# Patient Record
Sex: Female | Born: 1960
Health system: Southern US, Community
[De-identification: ages and names within clinical notes are randomized; demographics above are authoritative.]

## PROBLEM LIST (undated history)

## (undated) DIAGNOSIS — F329 Major depressive disorder, single episode, unspecified: Secondary | ICD-10-CM

## (undated) DIAGNOSIS — F32A Depression, unspecified: Secondary | ICD-10-CM

## (undated) DIAGNOSIS — E079 Disorder of thyroid, unspecified: Secondary | ICD-10-CM

## (undated) DIAGNOSIS — T7840XA Allergy, unspecified, initial encounter: Secondary | ICD-10-CM

## (undated) DIAGNOSIS — R51 Headache: Secondary | ICD-10-CM

## (undated) DIAGNOSIS — G4733 Obstructive sleep apnea (adult) (pediatric): Secondary | ICD-10-CM

## (undated) DIAGNOSIS — F419 Anxiety disorder, unspecified: Secondary | ICD-10-CM

## (undated) DIAGNOSIS — E785 Hyperlipidemia, unspecified: Secondary | ICD-10-CM

## (undated) HISTORY — PX: HERNIA REPAIR: SHX51

## (undated) HISTORY — DX: Major depressive disorder, single episode, unspecified: F32.9

## (undated) HISTORY — PX: ABDOMINAL HYSTERECTOMY: SHX81

## (undated) HISTORY — DX: Depression, unspecified: F32.A

## (undated) HISTORY — DX: Headache: R51

## (undated) HISTORY — DX: Allergy, unspecified, initial encounter: T78.40XA

## (undated) HISTORY — DX: Disorder of thyroid, unspecified: E07.9

## (undated) HISTORY — DX: Hyperlipidemia, unspecified: E78.5

## (undated) HISTORY — DX: Anxiety disorder, unspecified: F41.9

## (undated) HISTORY — DX: Obstructive sleep apnea (adult) (pediatric): G47.33

---

## 2007-03-25 ENCOUNTER — Encounter: Payer: Self-pay | Admitting: Internal Medicine

## 2007-04-07 ENCOUNTER — Ambulatory Visit: Payer: Self-pay | Admitting: Internal Medicine

## 2007-04-07 DIAGNOSIS — F4323 Adjustment disorder with mixed anxiety and depressed mood: Secondary | ICD-10-CM

## 2007-04-07 DIAGNOSIS — J309 Allergic rhinitis, unspecified: Secondary | ICD-10-CM | POA: Insufficient documentation

## 2007-04-07 DIAGNOSIS — E039 Hypothyroidism, unspecified: Secondary | ICD-10-CM | POA: Insufficient documentation

## 2007-04-07 DIAGNOSIS — R519 Headache, unspecified: Secondary | ICD-10-CM | POA: Insufficient documentation

## 2007-04-07 DIAGNOSIS — R51 Headache: Secondary | ICD-10-CM

## 2007-04-07 DIAGNOSIS — E785 Hyperlipidemia, unspecified: Secondary | ICD-10-CM

## 2007-04-08 LAB — CONVERTED CEMR LAB
ALT: 17 units/L (ref 0–35)
Albumin: 3.9 g/dL (ref 3.5–5.2)
BUN: 10 mg/dL (ref 6–23)
Basophils Relative: 0.7 % (ref 0.0–1.0)
CO2: 32 meq/L (ref 19–32)
Calcium: 9.5 mg/dL (ref 8.4–10.5)
Chloride: 105 meq/L (ref 96–112)
Cholesterol: 170 mg/dL (ref 0–200)
Creatinine, Ser: 0.7 mg/dL (ref 0.4–1.2)
Eosinophils Absolute: 0.2 10*3/uL (ref 0.0–0.6)
Eosinophils Relative: 2.1 % (ref 0.0–5.0)
Hemoglobin: 12.6 g/dL (ref 12.0–15.0)
MCV: 86.1 fL (ref 78.0–100.0)
Neutrophils Relative %: 66.7 % (ref 43.0–77.0)
RBC: 4.48 M/uL (ref 3.87–5.11)
Total Bilirubin: 0.6 mg/dL (ref 0.3–1.2)
WBC: 7.7 10*3/uL (ref 4.5–10.5)

## 2007-06-22 ENCOUNTER — Ambulatory Visit (HOSPITAL_COMMUNITY): Admission: RE | Admit: 2007-06-22 | Discharge: 2007-06-22 | Payer: Self-pay | Admitting: Obstetrics and Gynecology

## 2007-07-07 ENCOUNTER — Encounter: Admission: RE | Admit: 2007-07-07 | Discharge: 2007-07-07 | Payer: Self-pay | Admitting: Obstetrics and Gynecology

## 2007-10-20 ENCOUNTER — Encounter (INDEPENDENT_AMBULATORY_CARE_PROVIDER_SITE_OTHER): Payer: Self-pay

## 2008-01-19 ENCOUNTER — Encounter: Payer: Self-pay | Admitting: Internal Medicine

## 2008-02-03 ENCOUNTER — Encounter: Payer: Self-pay | Admitting: Internal Medicine

## 2008-02-04 ENCOUNTER — Telehealth: Payer: Self-pay | Admitting: Internal Medicine

## 2008-04-07 ENCOUNTER — Telehealth: Payer: Self-pay | Admitting: Internal Medicine

## 2008-09-02 ENCOUNTER — Ambulatory Visit: Payer: Self-pay | Admitting: Internal Medicine

## 2008-09-02 DIAGNOSIS — N390 Urinary tract infection, site not specified: Secondary | ICD-10-CM

## 2008-09-02 LAB — CONVERTED CEMR LAB
Blood in Urine, dipstick: NEGATIVE
Glucose, Urine, Semiquant: NEGATIVE
Ketones, urine, test strip: NEGATIVE
TSH: 0.32 microintl units/mL — ABNORMAL LOW (ref 0.35–5.50)
pH: 6

## 2009-02-01 ENCOUNTER — Encounter: Admission: RE | Admit: 2009-02-01 | Discharge: 2009-02-01 | Payer: Self-pay | Admitting: Obstetrics and Gynecology

## 2009-02-20 ENCOUNTER — Ambulatory Visit: Payer: Self-pay | Admitting: Internal Medicine

## 2009-02-20 DIAGNOSIS — Z872 Personal history of diseases of the skin and subcutaneous tissue: Secondary | ICD-10-CM | POA: Insufficient documentation

## 2009-03-07 ENCOUNTER — Encounter: Payer: Self-pay | Admitting: Internal Medicine

## 2009-05-10 ENCOUNTER — Encounter: Payer: Self-pay | Admitting: Internal Medicine

## 2009-08-01 ENCOUNTER — Ambulatory Visit: Payer: Self-pay | Admitting: Internal Medicine

## 2009-08-01 DIAGNOSIS — R300 Dysuria: Secondary | ICD-10-CM | POA: Insufficient documentation

## 2009-08-01 LAB — CONVERTED CEMR LAB
Bilirubin Urine: NEGATIVE
Blood in Urine, dipstick: NEGATIVE
Specific Gravity, Urine: 1.01
pH: 7

## 2010-01-03 ENCOUNTER — Ambulatory Visit: Payer: Self-pay | Admitting: Family Medicine

## 2010-01-04 ENCOUNTER — Ambulatory Visit: Payer: Self-pay | Admitting: Internal Medicine

## 2010-02-04 ENCOUNTER — Encounter: Payer: Self-pay | Admitting: Obstetrics and Gynecology

## 2010-02-13 NOTE — Assessment & Plan Note (Signed)
Summary: UTI?dm   Vital Signs:  Patient profile:   50 year old female Weight:      159 pounds Temp:     98.1 degrees F BP sitting:   122 / 80  (right arm) Cuff size:   regular  Vitals Entered By: Duard Brady LPN (August 01, 2009 4:06 PM) CC: c/o dysuria - also questions r/t synthroid medication   CC:  c/o dysuria - also questions r/t synthroid medication.  History of Present Illness: 50 year old patient who is seen today for follow-up.  She was treated recently for a UTI with 3 days of Cipro.  Symptoms have improved, but still and some mild dysuria and frequency.  Her medications were discussed and for several weeks.  He has inadvertently been taking simvastatin plus Vytorin and has not been taking her Synthroid.  She has a history depression, controlled on Effexor.  Allergies: 1)  ! Sulf-10  Past History:  Past Medical History: Reviewed history from 04/07/2007 and no changes required. Allergic rhinitis Depression Headache Hyperlipidemia Hypothyroidism  Past Surgical History: Reviewed history from 04/07/2007 and no changes required. hernia repair, and gynecologic surgery for endometriosis combined surgery, 1993 complete hysterectomy 2000  Review of Systems       The patient complains of weight gain.  The patient denies anorexia, fever, weight loss, vision loss, decreased hearing, hoarseness, chest pain, syncope, dyspnea on exertion, peripheral edema, prolonged cough, headaches, hemoptysis, abdominal pain, melena, hematochezia, severe indigestion/heartburn, hematuria, incontinence, genital sores, muscle weakness, suspicious skin lesions, transient blindness, difficulty walking, depression, unusual weight change, abnormal bleeding, enlarged lymph nodes, angioedema, and breast masses.    Physical Exam  General:  overweight-appearing.  110/72overweight-appearing.   Head:  Normocephalic and atraumatic without obvious abnormalities. No apparent alopecia or balding. Eyes:   No corneal or conjunctival inflammation noted. EOMI. Perrla. Funduscopic exam benign, without hemorrhages, exudates or papilledema. Vision grossly normal. Ears:  External ear exam shows no significant lesions or deformities.  Otoscopic examination reveals clear canals, tympanic membranes are intact bilaterally without bulging, retraction, inflammation or discharge. Hearing is grossly normal bilaterally. Mouth:  Oral mucosa and oropharynx without lesions or exudates.  Teeth in good repair. Neck:  No deformities, masses, or tenderness noted. Lungs:  Normal respiratory effort, chest expands symmetrically. Lungs are clear to auscultation, no crackles or wheezes. Heart:  Normal rate and regular rhythm. S1 and S2 normal without gallop, murmur, click, rub or other extra sounds. Abdomen:  Bowel sounds positive,abdomen soft and non-tender without masses, organomegaly or hernias noted.   Impression & Recommendations:  Problem # 1:  DYSURIA (ICD-788.1)  The following medications were removed from the medication list:    Ciprofloxacin Hcl 500 Mg Tabs (Ciprofloxacin hcl) ..... One twice daily Her updated medication list for this problem includes:    Ciprofloxacin Hcl 500 Mg Tabs (Ciprofloxacin hcl) ..... One twice daily  Orders: UA Dipstick w/o Micro (manual) ([address])  The following medications were removed from the medication list:    Ciprofloxacin Hcl 500 Mg Tabs (Ciprofloxacin hcl) ..... One twice daily Her updated medication list for this problem includes:    Ciprofloxacin Hcl 500 Mg Tabs (Ciprofloxacin hcl) ..... One twice daily  Problem # 2:  HYPOTHYROIDISM (ICD-244.9)  Her updated medication list for this problem includes:    Synthroid 75 Mcg Tabs (Levothyroxine sodium) .Marland Kitchen... 1 once daily  Her updated medication list for this problem includes:    Synthroid 75 Mcg Tabs (Levothyroxine sodium) .Marland Kitchen... 1 once daily  Problem # 3:  HYPERLIPIDEMIA (  ICD-272.4)  Her updated medication list for this  problem includes:    Simvastatin 20 Mg Tabs (Simvastatin) .Marland Kitchen... 1 once daily  Her updated medication list for this problem includes:    Simvastatin 20 Mg Tabs (Simvastatin) .Marland Kitchen... 1 once daily  Complete Medication List: 1)  Effexor Xr 150 Mg Cp24 (Venlafaxine hcl) .Marland Kitchen.. 1 two times a day 2)  Simvastatin 20 Mg Tabs (Simvastatin) .Marland Kitchen.. 1 once daily 3)  Synthroid 75 Mcg Tabs (Levothyroxine sodium) .Marland Kitchen.. 1 once daily 4)  Ciprofloxacin Hcl 500 Mg Tabs (Ciprofloxacin hcl) .... One twice daily  Patient Instructions: 1)  Please schedule a follow-up appointment in 4 months. 2)  Limit your Sodium (Salt) to less than 2 grams a day(slightly less than 1/2 a teaspoon) to prevent fluid retention, swelling, or worsening of symptoms. 3)  It is important that you exercise regularly at least 20 minutes 5 times a week. If you develop chest pain, have severe difficulty breathing, or feel very tired , stop exercising immediately and seek medical attention. 4)  You need to lose weight. Consider a lower calorie diet and regular exercise.  Prescriptions: CIPROFLOXACIN HCL 500 MG TABS (CIPROFLOXACIN HCL) one twice daily  #14 x 0   Entered and Authorized by:   Gordy Savers  MD   Signed by:   Gordy Savers  MD on 08/01/2009   Method used:   Electronically to        [facility]  [place] [address] (retail)       [address]       [place], [place]  [address]       Ph: [phone] or [phone]       Fax: [phone]   RxID:   [id number] SYNTHROID 75 MCG  TABS (LEVOTHYROXINE SODIUM) 1 once daily  #90 x 6   Entered and Authorized by:   Gordy Savers  MD   Signed by:   Gordy Savers  MD on 08/01/2009   Method used:   Electronically to        [facility]  [place] [address] (retail)       [address]       [place], [place]  [address]       Ph: [phone] or [phone]       Fax: [phone]   RxID:   [id number] SIMVASTATIN 20 MG  TABS (SIMVASTATIN) 1 once daily  #90 x 6   Entered and  Authorized by:   Gordy Savers  MD   Signed by:   Gordy Savers  MD on 08/01/2009   Method used:   [facility]ronically to        [facility]  [place] [address] (retail)       [address]       [place], [place]  [address]       Ph: [phone] or [phone]       Fax: [phone]   RxID:   [id number] EFFEXOR XR 150 MG  CP24 (VENLAFAXINE HCL) 1 two times a day  #180 x 6   Entered and Authorized by:   Gordy Savers  MD   Signed by:   Gordy Savers  MD on 08/01/2009   Method used:   [facility]        [facility]  [place] [phone] (retail)       [address]       [place], [place]  [address]       Ph: [phone] or [phone]       Fax: [phone]   RxID:   [id number]   Laboratory Results   Urine Tests  Date/Time Received: August 01, 2009 4:20 PM  Date/Time Reported: August 01, 2009 4:21 PM   Routine Urinalysis   Color: yellow Appearance: Hazy Glucose: negative   (Normal Range: Negative) Bilirubin: negative   (Normal Range: Negative) Ketone: negative   (Normal Range: Negative) Spec. Gravity: 1.010   (Normal Range: 1.003-1.035) Blood: negative   (Normal Range: Negative) pH: 7.0   (Normal Range: 5.0-8.0) Protein: negative   (Normal Range: Negative) Urobilinogen: 0.2   (Normal Range: 0-1) Nitrite: negative   (Normal Range: Negative) Leukocyte Esterace: trace   (Normal Range: Negative)

## 2010-02-13 NOTE — Assessment & Plan Note (Signed)
Summary: RAISED RED PATCH ON CHEST AREA/CJR   Vital Signs:  Patient profile:   50 year old female Weight:      155 pounds Temp:     98.6 degrees F oral BP sitting:   118 / 72  (right arm) Cuff size:   regular  Vitals Entered By: Raechel Ache, RN (February 20, 2009 1:50 PM) CC: red raised area on chest   CC:  red raised area on chest.  History of Present Illness: 50 year old patient who was seen today with concerns of suspicious skin lesions over her anterior chest.  She has a history of depression and dyslipidemia, and hypothyroidism.  She is unclear of the duration of the lesions, but probably at least two to 3 months  Allergies: 1)  ! Sulf-10  Past History:  Past Medical History: Reviewed history from 04/07/2007 and no changes required. Allergic rhinitis Depression Headache Hyperlipidemia Hypothyroidism  Review of Systems       The patient complains of suspicious skin lesions.  The patient denies anorexia, fever, weight loss, weight gain, vision loss, decreased hearing, hoarseness, chest pain, syncope, dyspnea on exertion, peripheral edema, prolonged cough, headaches, hemoptysis, abdominal pain, melena, hematochezia, severe indigestion/heartburn, hematuria, incontinence, genital sores, muscle weakness, transient blindness, difficulty walking, depression, unusual weight change, abnormal bleeding, enlarged lymph nodes, angioedema, and breast masses.    Physical Exam  General:  Well-developed,well-nourished,in no acute distress; alert,appropriate and cooperative throughout examination Skin:  two slightly raised, nodular lesions  over the anterior chest   Impression & Recommendations:  Problem # 1:  SKIN LESION, HX OF (ICD-V13.3)  Orders: Dermatology Referral (Derma)  Complete Medication List: 1)  Effexor Xr 150 Mg Cp24 (Venlafaxine hcl) .Marland Kitchen.. 1 two times a day 2)  Simvastatin 20 Mg Tabs (Simvastatin) .Marland Kitchen.. 1 once daily 3)  Synthroid 75 Mcg Tabs (Levothyroxine  sodium) .Marland Kitchen.. 1 once daily 4)  Ciprofloxacin Hcl 500 Mg Tabs (Ciprofloxacin hcl) .... One twice daily  Patient Instructions: 1)  dermatology referral as scheduled

## 2010-02-13 NOTE — Letter (Signed)
[facility]   Imported By: Maryln Gottron 05/15/2009 10:56:22  _____________________________________________________________________  External Attachment:    Type:   Image     Comment:   External Document

## 2010-02-13 NOTE — Consult Note (Signed)
Summary: [facility]  [facility]   Imported By: Maryln Gottron 03/23/2009 12:59:28  _____________________________________________________________________  External Attachment:    Type:   Image     Comment:   External Document

## 2010-02-15 NOTE — Assessment & Plan Note (Signed)
Summary: COUGH, CONGESTION // RS   Vital Signs:  Patient profile:   50 year old female Weight:      159 pounds Temp:     98.4 degrees F oral BP sitting:   128 / 72  (left arm) Cuff size:   regular CC: cold? head congestion/tight chest  Is Patient Diabetic? No   CC:  cold? head congestion/tight chest .  History of Present Illness: Sarah Wagner is a 50 year old, married female, nonsmoker, who comes in with a 5-day history of head congestion, postnasal drip, and cough.  No fever no earache no sore throat.  No sputum production.  No history of any lung disease or asthma.     Allergies: 1)  ! Sulf-10  Past History:  Past medical, surgical, family and social histories (including risk factors) reviewed for relevance to current acute and chronic problems.  Past Medical History: Reviewed history from 04/07/2007 and no changes required. Allergic rhinitis Depression Headache Hyperlipidemia Hypothyroidism  Past Surgical History: Reviewed history from 04/07/2007 and no changes required. hernia repair, and gynecologic surgery for endometriosis combined surgery, 1993 complete hysterectomy 2000  Family History: Reviewed history from 04/07/2007 and no changes required. father died at age 32, brain cancer.  Medical problems include diabetes Crohn's disease Graves' disease, dyslipidemia mother is 66, history of ovarian cancer history Parkinson's disease  sister with Crohn's colitis  Social History: Reviewed history from 04/07/2007 and no changes required. Married two children, a son, age 59, daughter, who is married at age 25  Review of Systems      See HPI  Physical Exam  General:  Well-developed,well-nourished,in no acute distress; alert,appropriate and cooperative throughout examination Head:  Normocephalic and atraumatic without obvious abnormalities. No apparent alopecia or balding. Eyes:  No corneal or conjunctival inflammation noted. EOMI. Perrla. Funduscopic exam benign,  without hemorrhages, exudates or papilledema. Vision grossly normal. Ears:  left ear canal normal.  Eardrum normal right ear canal packed with wax flushed Nose:  External nasal examination shows no deformity or inflammation. Nasal mucosa are pink and moist without lesions or exudates. Mouth:  Oral mucosa and oropharynx without lesions or exudates.  Teeth in good repair. Neck:  No deformities, masses, or tenderness noted. Lungs:  Normal respiratory effort, chest expands symmetrically. Lungs are clear to auscultation, no crackles or wheezes.   Problems:  Medical Problems Added: 1)  Dx of Viral Infection-unspec  (ICD-079.99)  Impression & Recommendations:  Problem # 1:  VIRAL INFECTION-UNSPEC (ICD-079.99) Assessment New  Her updated medication list for this problem includes:    Hydromet 5-1.5 Mg/84ml Syrp (Hydrocodone-homatropine) .Marland Kitchen... 1/2 to 1 tsp three times a day as needed cough  Complete Medication List: 1)  Effexor Xr 150 Mg Cp24 (Venlafaxine hcl) .Marland Kitchen.. 1 two times a day 2)  Simvastatin 20 Mg Tabs (Simvastatin) .Marland Kitchen.. 1 once daily 3)  Synthroid 75 Mcg Tabs (Levothyroxine sodium) .Marland Kitchen.. 1 once daily 4)  Ciprofloxacin Hcl 500 Mg Tabs (Ciprofloxacin hcl) .... One twice daily 5)  Hydromet 5-1.5 Mg/60ml Syrp (Hydrocodone-homatropine) .... 1/2 to 1 tsp three times a day as needed cough  Patient Instructions: 1)  drink 20 to 30 ounces of water daily. 2)  Vaporizer in her bedroom at night. 3)  Hydromet one half to 1 teaspoon at bedtime as needed for cough and cold Prescriptions: HYDROMET 5-1.5 MG/5ML SYRP (HYDROCODONE-HOMATROPINE) 1/2 to 1 tsp three times a day as needed cough  #4oz x 1   Entered and Authorized by:   Roderick Pee MD  Signed by:   Roderick Pee MD on 01/03/2010   Method used:   Print then Give to Patient   RxID:   [id number]    Orders Added: 1)  Est. Patient Level III [[address]]

## 2010-05-28 ENCOUNTER — Other Ambulatory Visit (HOSPITAL_COMMUNITY): Payer: Self-pay | Admitting: Obstetrics and Gynecology

## 2010-05-28 DIAGNOSIS — Z1231 Encounter for screening mammogram for malignant neoplasm of breast: Secondary | ICD-10-CM

## 2010-06-06 ENCOUNTER — Ambulatory Visit (HOSPITAL_COMMUNITY)
Admission: RE | Admit: 2010-06-06 | Discharge: 2010-06-06 | Disposition: A | Discharge status: Home / Self Care | Payer: Managed Care, Other (non HMO) | Source: Ambulatory Visit | Attending: Obstetrics and Gynecology | Admitting: Obstetrics and Gynecology

## 2010-06-06 DIAGNOSIS — Z1231 Encounter for screening mammogram for malignant neoplasm of breast: Secondary | ICD-10-CM | POA: Insufficient documentation

## 2010-08-27 ENCOUNTER — Other Ambulatory Visit: Payer: Self-pay | Admitting: Internal Medicine

## 2010-09-26 ENCOUNTER — Other Ambulatory Visit: Payer: Self-pay | Admitting: Obstetrics and Gynecology

## 2010-09-26 DIAGNOSIS — N61 Mastitis without abscess: Secondary | ICD-10-CM

## 2010-09-26 DIAGNOSIS — Z803 Family history of malignant neoplasm of breast: Secondary | ICD-10-CM

## 2010-09-27 ENCOUNTER — Other Ambulatory Visit: Payer: Self-pay | Admitting: Obstetrics and Gynecology

## 2010-09-27 DIAGNOSIS — N61 Mastitis without abscess: Secondary | ICD-10-CM

## 2010-09-27 DIAGNOSIS — Z803 Family history of malignant neoplasm of breast: Secondary | ICD-10-CM

## 2010-09-28 ENCOUNTER — Ambulatory Visit
Admission: RE | Admit: 2010-09-28 | Discharge: 2010-09-28 | Disposition: A | Discharge status: Home / Self Care | Payer: Managed Care, Other (non HMO) | Source: Ambulatory Visit | Attending: Obstetrics and Gynecology | Admitting: Obstetrics and Gynecology

## 2010-09-28 DIAGNOSIS — Z803 Family history of malignant neoplasm of breast: Secondary | ICD-10-CM

## 2010-09-28 DIAGNOSIS — N61 Mastitis without abscess: Secondary | ICD-10-CM

## 2010-11-22 ENCOUNTER — Telehealth: Payer: Self-pay

## 2010-11-22 NOTE — Telephone Encounter (Signed)
Pt called and states she feels like she has a virus and some wheezing along with chest congestion.  Pt states she is going out of town on Saturday and is concerned about her symptoms.  Pt is aware she has an appt on 11/23/10 at 4:00 pm.

## 2010-11-23 ENCOUNTER — Encounter: Payer: Self-pay | Admitting: Internal Medicine

## 2010-11-23 ENCOUNTER — Ambulatory Visit (INDEPENDENT_AMBULATORY_CARE_PROVIDER_SITE_OTHER): Payer: Managed Care, Other (non HMO) | Admitting: Internal Medicine

## 2010-11-23 DIAGNOSIS — J309 Allergic rhinitis, unspecified: Secondary | ICD-10-CM

## 2010-11-23 DIAGNOSIS — J069 Acute upper respiratory infection, unspecified: Secondary | ICD-10-CM

## 2010-11-23 MED ORDER — LEVOTHYROXINE SODIUM 75 MCG PO TABS: 75.0000 ug | ORAL_TABLET | Freq: Every day | ORAL | Status: DC

## 2010-11-23 MED ORDER — VENLAFAXINE HCL ER 150 MG PO CP24: 150.0000 mg | ORAL_CAPSULE | Freq: Every day | ORAL | Status: DC

## 2010-11-23 MED ORDER — SIMVASTATIN 20 MG PO TABS: 20.0000 mg | ORAL_TABLET | Freq: Every day | ORAL | Status: DC

## 2010-11-23 NOTE — Patient Instructions (Signed)
Get plenty of rest, Drink lots of  clear liquids, and use Tylenol or ibuprofen for fever and discomfort.    Call or return to clinic prn if these symptoms worsen or fail to improve as anticipated.  

## 2010-11-23 NOTE — Progress Notes (Signed)
  Subjective:    Patient ID: Sarah Wagner, female    DOB: 21-Jun-1960, 50 y.o.   MRN: [id number]  HPI  50 year old patient who presents with a six-day history of chest congestion cough and malaise earlier in the illness she had fever that has resolved. She remains slightly weak with nonproductive cough. Medical regimen includes doxycycline for dermatologic indications. She is planning on leaving for [place] for a 7 hour car trip tomorrow.    Review of Systems  Constitutional: Positive for fatigue. Negative for fever.  HENT: Positive for congestion and rhinorrhea. Negative for hearing loss, sore throat, dental problem, sinus pressure and tinnitus.   Eyes: Negative for pain, discharge and visual disturbance.  Respiratory: Positive for cough. Negative for shortness of breath.   Cardiovascular: Negative for chest pain, palpitations and leg swelling.  Gastrointestinal: Negative for nausea, vomiting, abdominal pain, diarrhea, constipation, blood in stool and abdominal distention.  Genitourinary: Negative for dysuria, urgency, frequency, hematuria, flank pain, vaginal bleeding, vaginal discharge, difficulty urinating, vaginal pain and pelvic pain.  Musculoskeletal: Negative for joint swelling, arthralgias and gait problem.  Skin: Negative for rash.  Neurological: Negative for dizziness, syncope, speech difficulty, weakness, numbness and headaches.  Hematological: Negative for adenopathy.  Psychiatric/Behavioral: Negative for behavioral problems, dysphoric mood and agitation. The patient is not nervous/anxious.        Objective:   Physical Exam  Constitutional: She is oriented to person, place, and time. She appears well-developed and well-nourished.  HENT:  Head: Normocephalic.  Right Ear: External ear normal.  Left Ear: External ear normal.  Mouth/Throat: Oropharynx is clear and moist.  Eyes: Conjunctivae and EOM are normal. Pupils are equal, round, and reactive to light.  Neck: Normal range of  motion. Neck supple. No thyromegaly present.  Cardiovascular: Normal rate, regular rhythm, normal heart sounds and intact distal pulses.   Pulmonary/Chest: Effort normal and breath sounds normal.  Abdominal: Soft. Bowel sounds are normal. She exhibits no mass. There is no tenderness.  Musculoskeletal: Normal range of motion.  Lymphadenopathy:    She has no cervical adenopathy.  Neurological: She is alert and oriented to person, place, and time.  Skin: Skin is warm and dry. No rash noted.  Psychiatric: She has a normal mood and affect. Her behavior is normal.          Assessment & Plan:    Viral URI. Symptomatic treatment discussed. Return here when necessary

## 2010-12-03 ENCOUNTER — Ambulatory Visit (INDEPENDENT_AMBULATORY_CARE_PROVIDER_SITE_OTHER): Payer: Managed Care, Other (non HMO) | Admitting: Internal Medicine

## 2010-12-03 ENCOUNTER — Encounter: Payer: Self-pay | Admitting: Internal Medicine

## 2010-12-03 VITALS — BP 126/80 | Temp 98.1°F | Wt 152.0 lb

## 2010-12-03 DIAGNOSIS — J069 Acute upper respiratory infection, unspecified: Secondary | ICD-10-CM

## 2010-12-03 MED ORDER — HYDROCODONE-HOMATROPINE 5-1.5 MG/5ML PO SYRP: 5.0000 mL | ORAL_SOLUTION | Freq: Four times a day (QID) | ORAL | Status: AC | PRN

## 2010-12-03 NOTE — Patient Instructions (Signed)
Get plenty of rest, Drink lots of  clear liquids, and use Tylenol or ibuprofen for fever and discomfort.    Call or return to clinic prn if these symptoms worsen or fail to improve as anticipated.  

## 2010-12-03 NOTE — Progress Notes (Signed)
Subjective:    Patient ID: Sarah Wagner, female    DOB: January 13, 1961, 50 y.o.   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 At that time she was on doxycycline for dermatologic indications. She improved but over the past few days has had increasing cough congestion low-grade fever and weakness. Her ears feel full there is been some head and chest congestion.  Past Medical History  Diagnosis Date  . Allergy   . Depression   . Headache   . Hyperlipidemia   . Thyroid disease     History   Social History  . Marital Status: Married    Spouse Name: N/A    Number of Children: N/A  . Years of Education: N/A   Occupational History  . Not on file.   Social History Main Topics  . Smoking status: Never Smoker   . Smokeless tobacco: Not on file  . Alcohol Use: No  . Drug Use: No  . Sexually Active:    Other Topics Concern  . Not on file   Social History Narrative  . No narrative on file    Past Surgical History  Procedure Date  . Hernia repair   . Abdominal hysterectomy     Family History  Problem Relation Age of Onset  . Cancer Mother     ovarian  . Parkinsonism Mother   . Cancer Father     brain  . Diabetes Father   . Crohn's disease Father   . Graves' disease Father   . Hyperlipidemia Father   . Crohn's disease Sister     Allergies  Allergen Reactions  . Sulfacetamide Sodium     Current Outpatient Prescriptions on File Prior to Visit  Medication Sig Dispense Refill  . hydrocortisone (WESTCORT) 0.2 % cream       . levothyroxine (SYNTHROID) 75 MCG tablet Take 1 tablet (75 mcg total) by mouth daily.  90 tablet  4  . simvastatin (ZOCOR) 20 MG tablet Take 1 tablet (20 mg total) by mouth at bedtime.  90 tablet  4  . tretinoin (RETIN-A) 0.05 % cream       . venlafaxine (EFFEXOR-XR) 150 MG 24 hr capsule Take 1 capsule (150 mg total) by mouth daily.  90 capsule  4    BP 126/80  Temp(Src) 98.1 F (36.7 C) (Oral)  Wt  152 lb (68.947 kg)  SpO2 96%   Review of Systems  Constitutional: Positive for fever, activity change, appetite change and fatigue.  HENT: Negative for hearing loss, congestion, sore throat, rhinorrhea, dental problem, sinus pressure and tinnitus.   Eyes: Negative for pain, discharge and visual disturbance.  Respiratory: Positive for cough. Negative for shortness of breath.   Cardiovascular: Negative for chest pain, palpitations and leg swelling.  Gastrointestinal: Negative for nausea, vomiting, abdominal pain, diarrhea, constipation, blood in stool and abdominal distention.  Genitourinary: Negative for dysuria, urgency, frequency, hematuria, flank pain, vaginal bleeding, vaginal discharge, difficulty urinating, vaginal pain and pelvic pain.  Musculoskeletal: Negative for joint swelling, arthralgias and gait problem.  Skin: Negative for rash.  Neurological: Positive for weakness. Negative for dizziness, syncope, speech difficulty, numbness and headaches.  Hematological: Negative for adenopathy.  Psychiatric/Behavioral: Negative for behavioral problems, dysphoric mood and agitation. The patient is not nervous/anxious.        Objective:   Physical Exam  Constitutional: She is oriented to person, place, and time. She appears well-developed and well-nourished.  HENT:  Head: Normocephalic.  Right Ear: External ear normal.  Left Ear: External ear normal.  Mouth/Throat: Oropharynx is clear and moist.  Eyes: Conjunctivae and EOM are normal. Pupils are equal, round, and reactive to light.  Neck: Normal range of motion. Neck supple. No thyromegaly present.  Cardiovascular: Normal rate, regular rhythm, normal heart sounds and intact distal pulses.   Pulmonary/Chest: Effort normal and breath sounds normal.  Abdominal: Soft. Bowel sounds are normal. She exhibits no mass. There is no tenderness.  Musculoskeletal: Normal range of motion.  Lymphadenopathy:    She has no cervical adenopathy.    Neurological: She is alert and oriented to person, place, and time.  Skin: Skin is warm and dry. No rash noted.  Psychiatric: She has a normal mood and affect. Her behavior is normal.          Assessment & Plan:    Viral URI. Will treat symptomatically. Dyslipidemia. Stable on simvastatin   Will call or return to clinic prn if these symptoms worsen or fail to improve as anticipated.

## 2011-12-02 ENCOUNTER — Other Ambulatory Visit: Payer: Self-pay | Admitting: Internal Medicine

## 2011-12-02 NOTE — Telephone Encounter (Signed)
Med filled.  

## 2012-06-29 ENCOUNTER — Telehealth: Payer: Self-pay | Admitting: Internal Medicine

## 2012-06-29 NOTE — Telephone Encounter (Signed)
Have patient check with her insurance about referral to ENT and their coverage;  this would also be acceptable referral

## 2012-06-29 NOTE — Telephone Encounter (Signed)
Pt was referred to oral surgeon by her dentist for a fibroid on her lower lip. [employer] would not cover that biopsy if done by oral surgeon. Wanting to know what other specialty would be appropriate to do this procedure (CPT code [address]). ENT? Pt requesting referral to whatever specialty is appropriate, and they will check with [employer] to see who within that specialty is covered under their network. Please advise.

## 2012-06-30 NOTE — Telephone Encounter (Signed)
I called and spoke to [place]. She will check with [employer] to see what ENT providers are covered in her network and call us back with a name so we can enter a referral.

## 2012-08-29 ENCOUNTER — Other Ambulatory Visit: Payer: Self-pay | Admitting: Internal Medicine

## 2012-09-02 ENCOUNTER — Ambulatory Visit (INDEPENDENT_AMBULATORY_CARE_PROVIDER_SITE_OTHER): Payer: Managed Care, Other (non HMO) | Admitting: Internal Medicine

## 2012-09-02 ENCOUNTER — Encounter: Payer: Self-pay | Admitting: Internal Medicine

## 2012-09-02 VITALS — BP 120/80 | HR 100 | Temp 98.2°F | Ht 60.0 in | Wt 147.0 lb

## 2012-09-02 DIAGNOSIS — E039 Hypothyroidism, unspecified: Secondary | ICD-10-CM

## 2012-09-02 DIAGNOSIS — F329 Major depressive disorder, single episode, unspecified: Secondary | ICD-10-CM

## 2012-09-02 DIAGNOSIS — Z23 Encounter for immunization: Secondary | ICD-10-CM

## 2012-09-02 DIAGNOSIS — E785 Hyperlipidemia, unspecified: Secondary | ICD-10-CM

## 2012-09-02 DIAGNOSIS — Z0000 Encounter for general adult medical examination without abnormal findings: Secondary | ICD-10-CM

## 2012-09-02 DIAGNOSIS — J309 Allergic rhinitis, unspecified: Secondary | ICD-10-CM

## 2012-09-02 DIAGNOSIS — F3289 Other specified depressive episodes: Secondary | ICD-10-CM

## 2012-09-02 LAB — COMPREHENSIVE METABOLIC PANEL WITH GFR
ALT: 11 U/L (ref 0–35)
AST: 14 U/L (ref 0–37)
Albumin: 4 g/dL (ref 3.5–5.2)
Alkaline Phosphatase: 73 U/L (ref 39–117)
BUN: 12 mg/dL (ref 6–23)
CO2: 29 meq/L (ref 19–32)
Calcium: 9.2 mg/dL (ref 8.4–10.5)
Chloride: 105 meq/L (ref 96–112)
Creatinine, Ser: 0.7 mg/dL (ref 0.4–1.2)
GFR: 88.45 mL/min
Glucose, Bld: 107 mg/dL — ABNORMAL HIGH (ref 70–99)
Potassium: 4.8 meq/L (ref 3.5–5.1)
Sodium: 139 meq/L (ref 135–145)
Total Bilirubin: 0.4 mg/dL (ref 0.3–1.2)
Total Protein: 7.7 g/dL (ref 6.0–8.3)

## 2012-09-02 LAB — LIPID PANEL
Cholesterol: 177 mg/dL (ref 0–200)
HDL: 38.7 mg/dL — ABNORMAL LOW
LDL Cholesterol: 105 mg/dL — ABNORMAL HIGH (ref 0–99)
Total CHOL/HDL Ratio: 5
Triglycerides: 166 mg/dL — ABNORMAL HIGH (ref 0.0–149.0)
VLDL: 33.2 mg/dL (ref 0.0–40.0)

## 2012-09-02 LAB — CBC WITH DIFFERENTIAL/PLATELET
Basophils Absolute: 0 10*3/uL (ref 0.0–0.1)
Basophils Relative: 0.5 % (ref 0.0–3.0)
Eosinophils Absolute: 0.2 10*3/uL (ref 0.0–0.7)
Eosinophils Relative: 2.5 % (ref 0.0–5.0)
HCT: 39.9 % (ref 36.0–46.0)
Hemoglobin: 13.5 g/dL (ref 12.0–15.0)
Lymphocytes Relative: 27.2 % (ref 12.0–46.0)
Lymphs Abs: 1.9 10*3/uL (ref 0.7–4.0)
MCHC: 33.8 g/dL (ref 30.0–36.0)
MCV: 84.5 fl (ref 78.0–100.0)
Monocytes Absolute: 0.5 10*3/uL (ref 0.1–1.0)
Monocytes Relative: 6.4 % (ref 3.0–12.0)
Neutro Abs: 4.5 10*3/uL (ref 1.4–7.7)
Neutrophils Relative %: 63.4 % (ref 43.0–77.0)
Platelets: 286 10*3/uL (ref 150.0–400.0)
RBC: 4.72 Mil/uL (ref 3.87–5.11)
RDW: 13.4 % (ref 11.5–14.6)
WBC: 7.1 10*3/uL (ref 4.5–10.5)

## 2012-09-02 LAB — TSH: TSH: 0.11 u[IU]/mL — ABNORMAL LOW (ref 0.35–5.50)

## 2012-09-02 MED ORDER — LEVOTHYROXINE SODIUM 75 MCG PO TABS: ORAL_TABLET | ORAL | Status: DC

## 2012-09-02 MED ORDER — VENLAFAXINE HCL ER 150 MG PO CP24: ORAL_CAPSULE | ORAL | Status: DC

## 2012-09-02 MED ORDER — SIMVASTATIN 20 MG PO TABS: ORAL_TABLET | ORAL | Status: DC

## 2012-09-02 NOTE — Progress Notes (Signed)
  Subjective:    Patient ID: Sarah Wagner, female    DOB: [birth year], 52 y.o.   MRN: [id number]  HPI  Current Allergies:  ! SULF-10 (SULFACETAMIDE SODIUM)   Past Medical History:  Allergic rhinitis  Depression  Headache  Hyperlipidemia  Hypothyroidism  (negative BRAC genetics)  Past Surgical History:  hernia repair, and gynecologic surgery for endometriosis combined surgery, 1993  complete hysterectomy 2000  Colonoscopy age 36  Family History:  father died at age 19, brain cancer. Medical problems include diabetes Crohn's disease Graves' disease, dyslipidemia  mother is 97, history of ovarian cancer history Parkinson's disease  sister with Crohn's colitis   Social History:  Married  two children, a son, age 44, daughter, who is married at age 19    Review of Systems     Objective:   Physical Exam        Assessment & Plan:

## 2012-09-02 NOTE — Progress Notes (Signed)
  Subjective:    Patient ID: Sarah Wagner, female    DOB: 1960/06/10, 52 y.o.   MRN: [id number]  HPI  52 year old patient who is seen today for a preventive health examination. She has treated dyslipidemia mild depression and hypothyroidism.  Family history is pertinent for a history of ovarian cancer affecting her mother. She has had genetic testing and is BRAC negative  She is status post hysterectomy for endometriosis. She is followed by gynecology in frequently.  Current Allergies:  ! SULF-10 (SULFACETAMIDE SODIUM)   Past Medical History:  Allergic rhinitis  Depression  Headache  Hyperlipidemia  Hypothyroidism   Past Surgical History:  hernia repair, and gynecologic surgery for endometriosis combined surgery, 1993  complete hysterectomy 2000  Colonoscopy age 29  Family History:  father died at age 8, brain cancer. Medical problems include diabetes Crohn's disease Graves' disease, dyslipidemia  mother, history of ovarian cancer history Parkinson's disease  sister with Crohn's colitis   Social History:  Married  two children, a son, age 46, daughter, who is married at age 71     Review of Systems  Constitutional: Positive for unexpected weight change. Negative for fever, appetite change and fatigue.  HENT: Negative for hearing loss, ear pain, nosebleeds, congestion, sore throat, mouth sores, trouble swallowing, neck stiffness, dental problem, voice change, sinus pressure and tinnitus.   Eyes: Negative for photophobia, pain, redness and visual disturbance.  Respiratory: Negative for cough, chest tightness and shortness of breath.   Cardiovascular: Negative for chest pain, palpitations and leg swelling.  Gastrointestinal: Negative for nausea, vomiting, abdominal pain, diarrhea, constipation, blood in stool, abdominal distention and rectal pain.  Genitourinary: Negative for dysuria, urgency, frequency, hematuria, flank pain, vaginal bleeding, vaginal discharge, difficulty  urinating, genital sores, vaginal pain, menstrual problem and pelvic pain.  Musculoskeletal: Negative for back pain and arthralgias.  Skin: Negative for rash.  Neurological: Negative for dizziness, syncope, speech difficulty, weakness, light-headedness, numbness and headaches.  Hematological: Negative for adenopathy. Does not bruise/bleed easily.  Psychiatric/Behavioral: Negative for suicidal ideas, behavioral problems, self-injury, dysphoric mood and agitation. The patient is not nervous/anxious.        Objective:   Physical Exam  Constitutional: She is oriented to person, place, and time. She appears well-developed and well-nourished.  HENT:  Head: Normocephalic and atraumatic.  Right Ear: External ear normal.  Left Ear: External ear normal.  Mouth/Throat: Oropharynx is clear and moist.  Eyes: Conjunctivae and EOM are normal.  Neck: Normal range of motion. Neck supple. No JVD present. No thyromegaly present.  Cardiovascular: Normal rate, regular rhythm, normal heart sounds and intact distal pulses.   No murmur heard. Pulmonary/Chest: Effort normal and breath sounds normal. She has no wheezes. She has no rales.  Abdominal: Soft. Bowel sounds are normal. She exhibits no distension and no mass. There is no tenderness. There is no rebound and no guarding.  Musculoskeletal: Normal range of motion. She exhibits no edema and no tenderness.  Neurological: She is alert and oriented to person, place, and time. She has normal reflexes. No cranial nerve deficit. She exhibits normal muscle tone. Coordination normal.  Skin: Skin is warm and dry. No rash noted.  Psychiatric: She has a normal mood and affect. Her behavior is normal.          Assessment & Plan:   Preventive health examination Dyslipidemia. We'll check a lipid profile Depression stable continue present therapy  Recheck 1 year or as needed Follow gynecology Mammogram

## 2012-09-02 NOTE — Patient Instructions (Signed)
You need to lose weight.  Consider a lower calorie diet and regular exercise.    It is important that you exercise regularly, at least 20 minutes 3 to 4 times per week.  If you develop chest pain or shortness of breath seek  medical attention.  Return in one year for follow-up  

## 2012-09-03 ENCOUNTER — Other Ambulatory Visit: Payer: Self-pay | Admitting: *Deleted

## 2012-09-03 MED ORDER — LEVOTHYROXINE SODIUM 50 MCG PO TABS: 50.0000 ug | ORAL_TABLET | Freq: Every day | ORAL | Status: DC

## 2013-06-14 ENCOUNTER — Telehealth: Payer: Self-pay | Admitting: Internal Medicine

## 2013-06-14 DIAGNOSIS — E039 Hypothyroidism, unspecified: Secondary | ICD-10-CM

## 2013-06-14 NOTE — Telephone Encounter (Signed)
Okay to order TSH? See message from pt.

## 2013-06-14 NOTE — Telephone Encounter (Signed)
ok 

## 2013-06-14 NOTE — Telephone Encounter (Signed)
Pt would like to have tsh check again. Md change her thyroid med 6 months ago. Can I sch?

## 2013-06-14 NOTE — Telephone Encounter (Signed)
Spoke to pt told her okay to repeat TSH order put in [facility] just need to call back and schedule lab appointment. Pt verbalized understanding.

## 2013-06-15 ENCOUNTER — Other Ambulatory Visit (INDEPENDENT_AMBULATORY_CARE_PROVIDER_SITE_OTHER): Payer: Managed Care, Other (non HMO)

## 2013-06-15 DIAGNOSIS — E039 Hypothyroidism, unspecified: Secondary | ICD-10-CM

## 2013-06-15 LAB — TSH: TSH: 4.55 u[IU]/mL — ABNORMAL HIGH (ref 0.35–4.50)

## 2013-06-21 ENCOUNTER — Telehealth: Payer: Self-pay | Admitting: Internal Medicine

## 2013-06-21 NOTE — Telephone Encounter (Signed)
Pt calling for TSH result, it was 4.55. Pt currently taking levothyroxine 50 mcg and change in dosage?

## 2013-06-21 NOTE — Telephone Encounter (Signed)
Pt would like results of tsh lab down last week.

## 2013-06-21 NOTE — Telephone Encounter (Signed)
Spoke to pt told her TSH was 4.55 , continue dosage prescribed, no change per Dr. Kirtland Bouchard. Pt verbalized understanding.

## 2013-06-21 NOTE — Telephone Encounter (Signed)
No change in therapy

## 2013-09-04 ENCOUNTER — Other Ambulatory Visit: Payer: Self-pay | Admitting: Internal Medicine

## 2013-12-02 ENCOUNTER — Other Ambulatory Visit: Payer: Self-pay | Admitting: Internal Medicine

## 2013-12-22 ENCOUNTER — Ambulatory Visit (INDEPENDENT_AMBULATORY_CARE_PROVIDER_SITE_OTHER): Payer: Managed Care, Other (non HMO) | Admitting: Family Medicine

## 2013-12-22 ENCOUNTER — Encounter: Payer: Self-pay | Admitting: Family Medicine

## 2013-12-22 VITALS — BP 128/80 | HR 88 | Temp 97.8°F | Wt 145.0 lb

## 2013-12-22 DIAGNOSIS — F411 Generalized anxiety disorder: Secondary | ICD-10-CM

## 2013-12-22 MED ORDER — VENLAFAXINE HCL ER 75 MG PO CP24: 75.0000 mg | ORAL_CAPSULE | Freq: Every day | ORAL | Status: DC

## 2013-12-22 NOTE — Patient Instructions (Signed)
Take an additional Effexor XR 75 mg (total of 225 mg daily) Follow up with Dr Kirtland BouchardK in 3-4 weeks if no improvement in anxiety symptoms.

## 2013-12-22 NOTE — Progress Notes (Signed)
   Subjective:    Patient ID: Sarah Wagner, female    DOB: 1960-08-27, 53 y.o.   MRN: [id number]  HPI Patient seen as a work in with increased anxiety symptoms. She has long history of anxiety and some depression and has been for many years on Effexor XR. Currently takes 150 mg once daily. Previously on dose up to 300 mg. She's had some stressors with her daughter undergoing recent separation and daughters had some issues with opioid abuse. She's had some difficulty sleeping. She started counseling yesterday. She is dealing with anxiety more than depression and previously has responded to higher doses of Effexor XR. She has very supportive husband. She continues to work full-time. She tends to have daily pervasive worries and these have escalated over recent months  Past Medical History  Diagnosis Date  . Allergy   . Depression   . Headache(784.0)   . Hyperlipidemia   . Thyroid disease    Past Surgical History  Procedure Laterality Date  . Hernia repair    . Abdominal hysterectomy      reports that she has never smoked. She does not have any smokeless tobacco history on file. She reports that she does not drink alcohol or use illicit drugs. family history includes Cancer in her father and mother; Crohn's disease in her father and sister; Diabetes in her father; Luiz BlareGraves' disease in her father; Hyperlipidemia in her father; Parkinsonism in her mother. Allergies  Allergen Reactions  . Sulfacetamide Sodium       Review of Systems  Constitutional: Negative for appetite change and unexpected weight change.  Respiratory: Negative for shortness of breath.   Cardiovascular: Negative for chest pain.  Psychiatric/Behavioral: Negative for suicidal ideas, confusion and agitation. The patient is nervous/anxious.        Objective:   Physical Exam  Constitutional: She appears well-developed and well-nourished.  Cardiovascular: Normal rate and regular rhythm.   No murmur  heard. Pulmonary/Chest: Effort normal and breath sounds normal. No respiratory distress. She has no wheezes. She has no rales.  Psychiatric: She has a normal mood and affect. Her behavior is normal. Judgment and thought content normal.          Assessment & Plan:  Chronic anxiety. Question generalized anxiety. Titrate Effexor XR to 225 mg once daily. Follow-up with primary in 3-4 weeks if not seeing significant improvements

## 2013-12-22 NOTE — Progress Notes (Signed)
Pre visit review using our clinic review tool, if applicable. No additional management support is needed unless otherwise documented below in the visit note. 

## 2014-03-03 ENCOUNTER — Other Ambulatory Visit: Payer: Self-pay | Admitting: Family Medicine

## 2014-03-23 ENCOUNTER — Other Ambulatory Visit: Payer: Self-pay | Admitting: Family Medicine

## 2014-03-24 NOTE — Telephone Encounter (Signed)
This looks like your pt.   

## 2014-06-03 ENCOUNTER — Telehealth: Payer: Self-pay | Admitting: Internal Medicine

## 2014-06-03 MED ORDER — VENLAFAXINE HCL ER 75 MG PO CP24: 75.0000 mg | ORAL_CAPSULE | Freq: Every day | ORAL | Status: DC

## 2014-06-03 MED ORDER — LEVOTHYROXINE SODIUM 50 MCG PO TABS: 50.0000 ug | ORAL_TABLET | Freq: Every day | ORAL | Status: DC

## 2014-06-03 MED ORDER — SIMVASTATIN 20 MG PO TABS: 20.0000 mg | ORAL_TABLET | Freq: Every day | ORAL | Status: DC

## 2014-06-03 MED ORDER — VENLAFAXINE HCL ER 150 MG PO CP24: 150.0000 mg | ORAL_CAPSULE | Freq: Every day | ORAL | Status: DC

## 2014-06-03 NOTE — Telephone Encounter (Signed)
Sarah Wagner, please work pt in sooner put two slots together for physical. I sent in 30 day supply of all medications until seen.

## 2014-06-03 NOTE — Telephone Encounter (Signed)
Pt not seen by dr Kirtland Bouchardk since 09/02/12. Pt is now out of meds. Will need refill of  levothyroxine (SYNTHROID, LEVOTHROID) 50 MCG tablet   (only has 3 tabs left) simvastatin (ZOCOR) 20 MG tablet venlafaxine XR (EFFEXOR XR) 75 MG 24 hr capsule venlafaxine XR (EFFEXOR-XR) 150 MG 24 hr capsule [facility] [place]   Made first availabe appt 7/21. Do I need to schedule pt sooner w/ a work in appt?

## 2014-06-03 NOTE — Telephone Encounter (Signed)
Pt has been scheduled sooner

## 2014-06-17 ENCOUNTER — Other Ambulatory Visit (INDEPENDENT_AMBULATORY_CARE_PROVIDER_SITE_OTHER): Payer: Managed Care, Other (non HMO)

## 2014-06-17 ENCOUNTER — Other Ambulatory Visit: Payer: Self-pay | Admitting: *Deleted

## 2014-06-17 DIAGNOSIS — E039 Hypothyroidism, unspecified: Secondary | ICD-10-CM

## 2014-06-17 DIAGNOSIS — Z0000 Encounter for general adult medical examination without abnormal findings: Secondary | ICD-10-CM | POA: Diagnosis not present

## 2014-06-17 LAB — CBC WITH DIFFERENTIAL/PLATELET
Basophils Absolute: 0.1 10*3/uL (ref 0.0–0.1)
Basophils Relative: 0.6 % (ref 0.0–3.0)
EOS ABS: 0.2 10*3/uL (ref 0.0–0.7)
Eosinophils Relative: 2.7 % (ref 0.0–5.0)
HEMATOCRIT: 41.6 % (ref 36.0–46.0)
Hemoglobin: 13.8 g/dL (ref 12.0–15.0)
LYMPHS ABS: 2.5 10*3/uL (ref 0.7–4.0)
Lymphocytes Relative: 31 % (ref 12.0–46.0)
MCHC: 33.1 g/dL (ref 30.0–36.0)
MCV: 86.4 fl (ref 78.0–100.0)
MONO ABS: 0.5 10*3/uL (ref 0.1–1.0)
MONOS PCT: 6.3 % (ref 3.0–12.0)
NEUTROS ABS: 4.8 10*3/uL (ref 1.4–7.7)
NEUTROS PCT: 59.4 % (ref 43.0–77.0)
Platelets: 305 10*3/uL (ref 150.0–400.0)
RBC: 4.81 Mil/uL (ref 3.87–5.11)
RDW: 13.7 % (ref 11.5–15.5)
WBC: 8.1 10*3/uL (ref 4.0–10.5)

## 2014-06-17 LAB — BASIC METABOLIC PANEL
BUN: 11 mg/dL (ref 6–23)
CALCIUM: 9.1 mg/dL (ref 8.4–10.5)
CO2: 31 mEq/L (ref 19–32)
CREATININE: 0.73 mg/dL (ref 0.40–1.20)
Chloride: 105 mEq/L (ref 96–112)
GFR: 87.86 mL/min (ref >60.00)
Glucose, Bld: 132 mg/dL — ABNORMAL HIGH (ref 70–99)
Potassium: 4.2 mEq/L (ref 3.5–5.1)
SODIUM: 140 meq/L (ref 135–145)

## 2014-06-17 LAB — LIPID PANEL
Cholesterol: 182 mg/dL (ref 0–200)
HDL: 44.9 mg/dL (ref >39.00)
LDL Cholesterol: 111 mg/dL — ABNORMAL HIGH (ref 0–99)
NONHDL: 137.1
TRIGLYCERIDES: 132 mg/dL (ref 0.0–149.0)
Total CHOL/HDL Ratio: 4
VLDL: 26.4 mg/dL (ref 0.0–40.0)

## 2014-06-17 LAB — POCT URINALYSIS DIPSTICK
Bilirubin, UA: NEGATIVE
GLUCOSE UA: NEGATIVE
Ketones, UA: NEGATIVE
Nitrite, UA: NEGATIVE
PH UA: 5.5
PROTEIN UA: NEGATIVE
RBC UA: NEGATIVE
UROBILINOGEN UA: 0.2

## 2014-06-17 LAB — HEPATIC FUNCTION PANEL
ALBUMIN: 4.1 g/dL (ref 3.5–5.2)
ALT: 13 U/L (ref 0–35)
AST: 14 U/L (ref 0–37)
Alkaline Phosphatase: 80 U/L (ref 39–117)
BILIRUBIN TOTAL: 0.3 mg/dL (ref 0.2–1.2)
Bilirubin, Direct: 0 mg/dL (ref 0.0–0.3)
TOTAL PROTEIN: 7.3 g/dL (ref 6.0–8.3)

## 2014-06-17 LAB — TSH: TSH: 6.26 u[IU]/mL — AB (ref 0.35–4.50)

## 2014-06-17 MED ORDER — LEVOTHYROXINE SODIUM 75 MCG PO TABS: 75.0000 ug | ORAL_TABLET | Freq: Every day | ORAL | Status: DC

## 2014-06-24 ENCOUNTER — Encounter: Payer: Managed Care, Other (non HMO) | Admitting: Internal Medicine

## 2014-07-07 ENCOUNTER — Encounter: Payer: Self-pay | Admitting: Internal Medicine

## 2014-07-07 ENCOUNTER — Ambulatory Visit (INDEPENDENT_AMBULATORY_CARE_PROVIDER_SITE_OTHER): Payer: Managed Care, Other (non HMO) | Admitting: Internal Medicine

## 2014-07-07 VITALS — BP 120/80 | HR 88 | Temp 98.0°F | Resp 20 | Ht 60.0 in | Wt 154.0 lb

## 2014-07-07 DIAGNOSIS — E039 Hypothyroidism, unspecified: Secondary | ICD-10-CM | POA: Diagnosis not present

## 2014-07-07 DIAGNOSIS — J3089 Other allergic rhinitis: Secondary | ICD-10-CM | POA: Diagnosis not present

## 2014-07-07 DIAGNOSIS — Z0000 Encounter for general adult medical examination without abnormal findings: Secondary | ICD-10-CM | POA: Diagnosis not present

## 2014-07-07 DIAGNOSIS — E785 Hyperlipidemia, unspecified: Secondary | ICD-10-CM | POA: Diagnosis not present

## 2014-07-07 MED ORDER — SIMVASTATIN 20 MG PO TABS: 20.0000 mg | ORAL_TABLET | Freq: Every day | ORAL | Status: DC

## 2014-07-07 MED ORDER — VENLAFAXINE HCL ER 150 MG PO CP24: 150.0000 mg | ORAL_CAPSULE | Freq: Every day | ORAL | Status: DC

## 2014-07-07 MED ORDER — VENLAFAXINE HCL ER 75 MG PO CP24: 75.0000 mg | ORAL_CAPSULE | Freq: Every day | ORAL | Status: DC

## 2014-07-07 MED ORDER — TRETINOIN 0.05 % EX CREA: TOPICAL_CREAM | Freq: Every day | CUTANEOUS | Status: DC

## 2014-07-07 NOTE — Patient Instructions (Signed)
It is important that you exercise regularly, at least 20 minutes 3 to 4 times per week.  If you develop chest pain or shortness of breath seek  medical attention.  You need to lose weight.  Consider a lower calorie diet and regular exercise.  Return in one year for follow-up  Health Maintenance Adopting a healthy lifestyle and getting preventive care can go a long way to promote health and wellness. Talk with your health care provider about what schedule of regular examinations is right for you. This is a good chance for you to check in with your provider about disease prevention and staying healthy. In between checkups, there are plenty of things you can do on your own. Experts have done a lot of research about which lifestyle changes and preventive measures are most likely to keep you healthy. Ask your health care provider for more information. WEIGHT AND DIET  Eat a healthy diet  Be sure to include plenty of vegetables, fruits, low-fat dairy products, and lean protein.  Do not eat a lot of foods high in solid fats, added sugars, or salt.  Get regular exercise. This is one of the most important things you can do for your health.  Most adults should exercise for at least 150 minutes each week. The exercise should increase your heart rate and make you sweat (moderate-intensity exercise).  Most adults should also do strengthening exercises at least twice a week. This is in addition to the moderate-intensity exercise.  Maintain a healthy weight  Body mass index (BMI) is a measurement that can be used to identify possible weight problems. It estimates body fat based on height and weight. Your health care provider can help determine your BMI and help you achieve or maintain a healthy weight.  For females 65 years of age and older:   A BMI below 18.5 is considered underweight.  A BMI of 18.5 to 24.9 is normal.  A BMI of 25 to 29.9 is considered overweight.  A BMI of 30 and above is  considered obese.  Watch levels of cholesterol and blood lipids  You should start having your blood tested for lipids and cholesterol at 55 years of age, then have this test every 5 years.  You may need to have your cholesterol levels checked more often if:  Your lipid or cholesterol levels are high.  You are older than 55 years of age.  You are at high risk for heart disease.  CANCER SCREENING   Lung Cancer  Lung cancer screening is recommended for adults 69-53 years old who are at high risk for lung cancer because of a history of smoking.  A yearly low-dose CT scan of the lungs is recommended for people who:  Currently smoke.  Have quit within the past 15 years.  Have at least a 30-pack-year history of smoking. A pack year is smoking an average of one pack of cigarettes a day for 1 year.  Yearly screening should continue until it has been 15 years since you quit.  Yearly screening should stop if you develop a health problem that would prevent you from having lung cancer treatment.  Breast Cancer  Practice breast self-awareness. This means understanding how your breasts normally appear and feel.  It also means doing regular breast self-exams. Let your health care provider know about any changes, no matter how small.  If you are in your 20s or 30s, you should have a clinical breast exam (CBE) by a health care provider  every 1-3 years as part of a regular health exam.  If you are 40 or older, have a CBE every year. Also consider having a breast X-ray (mammogram) every year.  If you have a family history of breast cancer, talk to your health care provider about genetic screening.  If you are at high risk for breast cancer, talk to your health care provider about having an MRI and a mammogram every year.  Breast cancer gene (BRCA) assessment is recommended for women who have family members with BRCA-related cancers. BRCA-related cancers  include:  Breast.  Ovarian.  Tubal.  Peritoneal cancers.  Results of the assessment will determine the need for genetic counseling and BRCA1 and BRCA2 testing. Cervical Cancer Routine pelvic examinations to screen for cervical cancer are no longer recommended for nonpregnant women who are considered low risk for cancer of the pelvic organs (ovaries, uterus, and vagina) and who do not have symptoms. A pelvic examination may be necessary if you have symptoms including those associated with pelvic infections. Ask your health care provider if a screening pelvic exam is right for you.   The Pap test is the screening test for cervical cancer for women who are considered at risk.  If you had a hysterectomy for a problem that was not cancer or a condition that could lead to cancer, then you no longer need Pap tests.  If you are older than 65 years, and you have had normal Pap tests for the past 10 years, you no longer need to have Pap tests.  If you have had past treatment for cervical cancer or a condition that could lead to cancer, you need Pap tests and screening for cancer for at least 20 years after your treatment.  If you no longer get a Pap test, assess your risk factors if they change (such as having a new sexual partner). This can affect whether you should start being screened again.  Some women have medical problems that increase their chance of getting cervical cancer. If this is the case for you, your health care provider may recommend more frequent screening and Pap tests.  The human papillomavirus (HPV) test is another test that may be used for cervical cancer screening. The HPV test looks for the virus that can cause cell changes in the cervix. The cells collected during the Pap test can be tested for HPV.  The HPV test can be used to screen women 30 years of age and older. Getting tested for HPV can extend the interval between normal Pap tests from three to five years.  An HPV  test also should be used to screen women of any age who have unclear Pap test results.  After 55 years of age, women should have HPV testing as often as Pap tests.  Colorectal Cancer  This type of cancer can be detected and often prevented.  Routine colorectal cancer screening usually begins at 55 years of age and continues through 55 years of age.  Your health care provider may recommend screening at an earlier age if you have risk factors for colon cancer.  Your health care provider may also recommend using home test kits to check for hidden blood in the stool.  A small camera at the end of a tube can be used to examine your colon directly (sigmoidoscopy or colonoscopy). This is done to check for the earliest forms of colorectal cancer.  Routine screening usually begins at age 50.  Direct examination of the colon should   be repeated every 5-10 years through 55 years of age. However, you may need to be screened more often if early forms of precancerous polyps or small growths are found. Skin Cancer  Check your skin from head to toe regularly.  Tell your health care provider about any new moles or changes in moles, especially if there is a change in a mole's shape or color.  Also tell your health care provider if you have a mole that is larger than the size of a pencil eraser.  Always use sunscreen. Apply sunscreen liberally and repeatedly throughout the day.  Protect yourself by wearing long sleeves, pants, a wide-brimmed hat, and sunglasses whenever you are outside. HEART DISEASE, DIABETES, AND HIGH BLOOD PRESSURE   Have your blood pressure checked at least every 1-2 years. High blood pressure causes heart disease and increases the risk of stroke.  If you are between 55 years and 79 years old, ask your health care provider if you should take aspirin to prevent strokes.  Have regular diabetes screenings. This involves taking a blood sample to check your fasting blood sugar  level.  If you are at a normal weight and have a low risk for diabetes, have this test once every three years after 55 years of age.  If you are overweight and have a high risk for diabetes, consider being tested at a younger age or more often. PREVENTING INFECTION  Hepatitis B  If you have a higher risk for hepatitis B, you should be screened for this virus. You are considered at high risk for hepatitis B if:  You were born in a country where hepatitis B is common. Ask your health care provider which countries are considered high risk.  Your parents were born in a high-risk country, and you have not been immunized against hepatitis B (hepatitis B vaccine).  You have HIV or AIDS.  You use needles to inject street drugs.  You live with someone who has hepatitis B.  You have had sex with someone who has hepatitis B.  You get hemodialysis treatment.  You take certain medicines for conditions, including cancer, organ transplantation, and autoimmune conditions. Hepatitis C  Blood testing is recommended for:  Everyone born from 1945 through 1965.  Anyone with known risk factors for hepatitis C. Sexually transmitted infections (STIs)  You should be screened for sexually transmitted infections (STIs) including gonorrhea and chlamydia if:  You are sexually active and are younger than 55 years of age.  You are older than 55 years of age and your health care provider tells you that you are at risk for this type of infection.  Your sexual activity has changed since you were last screened and you are at an increased risk for chlamydia or gonorrhea. Ask your health care provider if you are at risk.  If you do not have HIV, but are at risk, it may be recommended that you take a prescription medicine daily to prevent HIV infection. This is called pre-exposure prophylaxis (PrEP). You are considered at risk if:  You are sexually active and do not regularly use condoms or know the HIV status  of your partner(s).  You take drugs by injection.  You are sexually active with a partner who has HIV. Talk with your health care provider about whether you are at high risk of being infected with HIV. If you choose to begin PrEP, you should first be tested for HIV. You should then be tested every 3 months for as   long as you are taking PrEP.  PREGNANCY   If you are premenopausal and you may become pregnant, ask your health care provider about preconception counseling.  If you may become pregnant, take 400 to 800 micrograms (mcg) of folic acid every day.  If you want to prevent pregnancy, talk to your health care provider about birth control (contraception). OSTEOPOROSIS AND MENOPAUSE   Osteoporosis is a disease in which the bones lose minerals and strength with aging. This can result in serious bone fractures. Your risk for osteoporosis can be identified using a bone density scan.  If you are 25 years of age or older, or if you are at risk for osteoporosis and fractures, ask your health care provider if you should be screened.  Ask your health care provider whether you should take a calcium or vitamin D supplement to lower your risk for osteoporosis.  Menopause may have certain physical symptoms and risks.  Hormone replacement therapy may reduce some of these symptoms and risks. Talk to your health care provider about whether hormone replacement therapy is right for you.  HOME CARE INSTRUCTIONS   Schedule regular health, dental, and eye exams.  Stay current with your immunizations.   Do not use any tobacco products including cigarettes, chewing tobacco, or electronic cigarettes.  If you are pregnant, do not drink alcohol.  If you are breastfeeding, limit how much and how often you drink alcohol.  Limit alcohol intake to no more than 1 drink per day for nonpregnant women. One drink equals 12 ounces of beer, 5 ounces of wine, or 1 ounces of hard liquor.  Do not use street  drugs.  Do not share needles.  Ask your health care provider for help if you need support or information about quitting drugs.  Tell your health care provider if you often feel depressed.  Tell your health care provider if you have ever been abused or do not feel safe at home. Document Released: 07/16/2010 Document Revised: 05/17/2013 Document Reviewed: 12/02/2012 [facility] Patient Information 2015 [place], [place]. This information is not intended to replace advice given to you by your health care provider. Make sure you discuss any questions you have with your health care provider. Surgery for Rotator Cuff Tear with Rehab Rotator cuff surgery is only recommended for individuals who have experienced persistent disability for greater than 3 months of non-surgical (conservative) treatment. Surgery is not necessary but is recommended for individuals who experience difficulty completing daily activities or athletes who are unable to compete. Rotator cuff tears do not usually heal without surgical intervention. If left alone small rotator cuff tears usually become larger. Younger athletes who have a rotator cuff tear may be recommended for surgery without attempting conservative rehabilitation. The purpose of surgery is to regain function of the shoulder joint and eliminate pain associated with the injury. In addition to repairing the tendon tear, the surgery often removes a portion of the bony roof of the shoulder (acromion) as well as the chronically thickened and inflamed membrane below the acromion (subacromial bursa). REASONS NOT TO OPERATE   Infection of the shoulder.  Inability to complete a rehabilitation program.  Patients who have other conditions (emotional or psychological) conditions that contribute to their shoulder condition. RISKS AND COMPLICATIONS  Infection.  Re-tear of the rotator cuff tendons or muscles.  Shoulder stiffness and/or weakness.  Inability to compete in  athletics.  Acromioclavicular (AC) joint paint.  Risks of surgery: infection, bleeding, nerve damage, or damage to surrounding tissues. TECHNIQUE There  are different surgical procedures used to treat rotator cuff tears. The type of procedure depends on the extent of injury as well as the surgeon's preference. All of the surgical techniques for rotator cuff tears have the same goal of repairing the torn tendon, removing part of the acromion, and removing the subacromial bursa. There are two main types of procedures: arthroscopic and open incision. Arthroscopic procedures are usually completed and you go home the same day as surgery (outpatient). These procedures use multiple small incisions in which tools and a video camera are placed to work on the shoulder. An electric shaver removes the bursa, then a power burr shaves down the portion of the acromion that places pressure on the rotator cuff. Finally the rotator cuff is sewed (sutured) back to the humeral head. Open incision procedures require a larger incision. The deltoid muscle is detached from the acromion and a ligament in the shoulder (coracoacromial) is cut in order for the surgeon to access the rotator cuff. The subacromial bursa is removed as well as part of the acromion to give the rotator cuff room to move freely. The torn tendon is then sutured to the humeral head. After the rotator cuff is repaired, then the deltoid is reattached and the incision is closed up.  RECOVERY   Post-operative care depends on the surgical technique and the preferences of your therapist.  Keep the wound clean and dry for the first 10 to 14 days after surgery.  Keep your shoulder and arm in the sling provided to you for as long as you have been instructed to.  You will be given pain medications by your caregiver.  Passive (without using muscles) shoulder movements may be begun immediately after surgery.  It is important to follow through with you  rehabilitation program in order to have the best possible recovery. RETURN TO SPORTS   The rehabilitation period will depend on the sport and position you play as well as the success of the operation.  The minimum recovery period is 6 months.  You must have regained complete shoulder motion and strength before returning to sports. SEEK IMMEDIATE MEDICAL CARE IF:   Any medications produce adverse side effects.  Any complications from surgery occur:  Pain, numbness, or coldness in the extremity operated upon.  Discoloration of the nail beds (they become blue or gray) of the extremity operated upon.  Signs of infections (fever, pain, inflammation, redness, or persistent bleeding). EXERCISES  RANGE OF MOTION (ROM) AND STRETCHING EXERCISES - Rotator Cuff Tear, Surgery For These exercises may help you restore your elbow mobility once your physician has discontinued your immobilization period. Beginning these before your provider's approval may result in delayed healing. Your symptoms may resolve with or without further involvement from your physician, physical therapist or athletic trainer. While completing these exercises, remember:   Restoring tissue flexibility helps normal motion to return to the joints. This allows healthier, less painful movement and activity.  An effective stretch should be held for at least 30 seconds. A stretch should never be painful. You should only feel a gentle lengthening or release in the stretch. ROM - Pendulum   Bend at the waist so that your right / left arm falls away from your body. Support yourself with your opposite hand on a solid surface, such as a table or a countertop.  Your right / left arm should be perpendicular to the ground. If it is not perpendicular, you need to lean over farther. Relax the muscles in your  right / left arm and shoulder as much as possible.  Gently sway your hips and trunk so they move your right / left arm without any use of  your right / left shoulder muscles.  Progress your movements so that your right / left arm moves side to side, then forward and backward, and finally, both clockwise and counterclockwise.  Complete __________ repetitions in each direction. Many people use this exercise to relieve discomfort in their shoulder as well as to gain range of motion. Repeat __________ times. Complete this exercise __________ times per day. STRETCH - Flexion, Seated   Sit in a firm chair so that your right / left forearm can rest on a table or on a table or countertop. Your right / left elbow should rest below the height of your shoulder so that your shoulder feels supported and not tense or uncomfortable.  Keeping your right / left shoulder relaxed, lean forward at your waist, allowing your right / left hand to slide forward. Bend forward until you feel a moderate stretch in your shoulder, but before you feel an increase in your pain.  Hold __________ seconds. Slowly return to your starting position. Repeat __________ times. Complete this exercise __________ times per day.  STRETCH - Flexion, Standing   Stand with good posture. With an underhand grip on your right / left and an overhand grip on the opposite hand, grasp a broomstick or cane so that your hands are a little more than shoulder-width apart.  Keeping your right / left elbow straight and shoulder muscles relaxed, push the stick with your opposite hand to raise your right / left arm in front of your body and then overhead. Raise your arm until you feel a stretch in your right / left shoulder, but before you have increased shoulder pain.  Try to avoid shrugging your right / left shoulder as your arm rises by keeping your shoulder blade tucked down and toward your mid-back spine. Hold __________ seconds.  Slowly return to the starting position. Repeat __________ times. Complete this exercise __________ times per day.  STRETCH - Abduction, Supine   Stand  with good posture. With an underhand grip on your right / left and an overhand grip on the opposite hand, grasp a broomstick or cane so that your hands are a little more than shoulder-width apart.  Keeping your right / left elbow straight and shoulder muscles relaxed, push the stick with your opposite hand to raise your right / left arm out to the side of your body and then overhead. Raise your arm until you feel a stretch in your right / left shoulder, but before you have increased shoulder pain.  Try to avoid shrugging your right / left shoulder as your arm rises by keeping your shoulder blade tucked down and toward your mid-back spine. Hold __________ seconds.  Slowly return to the starting position. Repeat __________ times. Complete this exercise __________ times per day.  ROM - Flexion, Active-Assisted  Lie on your back. You may bend your knees for comfort.  Grasp a broomstick or cane so your hands are about shoulder-width apart. Your right / left hand should grip the end of the stick/cane so that your hand is positioned "thumbs-up," as if you were about to shake hands.  Using your healthy arm to lead, raise your right / left arm overhead until you feel a gentle stretch in your shoulder. Hold __________ seconds.  Use the stick/cane to assist in returning your right / left  arm to its starting position. Repeat __________ times. Complete this exercise __________ times per day.  STRETCH - External Rotation   Tuck a folded towel or small ball under your right / left upper arm. Grasp a broomstick or cane with an underhand grasp a little more than shoulder width apart. Bend your elbows to 90 degrees.  Stand with good posture or sit in a chair without arms.  Use your strong arm to push the stick across your body. Do not allow the towel or ball to fall. This will rotate your right / left arm away from your abdomen. Using the stick turn/rotate your hand and forearm away from your body. Hold  __________ seconds. Repeat __________ times. Complete this exercise __________ times per day.  STRENGTHENING EXERCISES - Rotator Cuff Tear, Surgery For These exercises may help you begin to restore your elbow strength in the initial stage of your rehabilitation. Your physician will determine when you begin these exercises depending on the severity of your injury and the integrity of your repaired tissues. Beginning these before your provider's approval may result in delayed healing. While completing these exercises, remember:   Muscles can gain both the endurance and the strength needed for everyday activities through controlled exercises.  Complete these exercises as instructed by your physician, physical therapist or athletic trainer. Progress the resistance and repetitions only as guided.  You may experience muscle soreness or fatigue, but the pain or discomfort you are trying to eliminate should never worsen during these exercises. If this pain does worsen, stop and make certain you are following the directions exactly. If the pain is still present after adjustments, discontinue the exercise until you can discuss the trouble with your clinician. STRENGTH - Shoulder Flexion, Isometric   With good posture and facing a wall, stand or sit about 4-6 inches away.  Keeping your right / left elbow straight, gently press the top of your fist into the wall. Increase the pressure gradually until you are pressing as hard as you can without shrugging your shoulder or increasing any shoulder discomfort.  Hold __________ seconds. Release the tension slowly. Relax your shoulder muscles completely before you do the next repetition. Repeat __________ times. Complete this exercise __________ times per day.  STRENGTH - Shoulder Abductors, Isometric   With good posture, stand or sit about 4-6 inches from a wall with your right / left side facing the wall.  Bend your right / left elbow. Gently press your right /  left elbow into the wall. Increase the pressure gradually until you are pressing as hard as you can without shrugging your shoulder or increasing any shoulder discomfort.  Hold __________ seconds.  Release the tension slowly. Relax your shoulder muscles completely before you do the next repetition. Repeat __________ times. Complete this exercise __________ times per day.  STRENGTH - Internal Rotators, Isometric   Keep your right / left elbow at your side and bend it 90 degrees.  Step into a door frame so that the inside of your right / left wrist can press against the door frame without your upper arm leaving your side.  Gently press your right / left wrist into the door frame as if you were trying to draw the palm of your hand to your abdomen. Gradually increase the tension until you are pressing as hard as you can without shrugging your shoulder or increasing any shoulder discomfort.  Hold __________ seconds.  Release the tension slowly. Relax your shoulder muscles completely before you do  the next repetition. Repeat __________ times. Complete this exercise __________ times per day.  STRENGTH - External Rotators, Isometric   Keep your right / left elbow at your side and bend it 90 degrees.  Step into a door frame so that the outside of your right / left wrist can press against the door frame without your upper arm leaving your side.  Gently press your right / left wrist into the door frame as if you were trying to swing the back of your hand away from your abdomen. Gradually increase the tension until you are pressing as hard as you can without shrugging your shoulder or increasing any shoulder discomfort.  Hold __________ seconds.  Release the tension slowly. Relax your shoulder muscles completely before you do the next repetition. Repeat __________ times. Complete this exercise __________ times per day.  Document Released: 12/31/2004 Document Revised: 03/25/2011 Document Reviewed:  04/14/2008 [facility] Patient Information 2015 [place], [place]. This information is not intended to replace advice given to you by your health care provider. Make sure you discuss any questions you have with your health care provider.

## 2014-07-07 NOTE — Progress Notes (Signed)
Subjective:    Patient ID: Sarah Wagner, female    DOB: [birth year], 54 y.o.   MRN: [id number]  HPI HPI 54 year old patient who is seen today for a preventive health examination. She has treated dyslipidemia mild depression and hypothyroidism.  Family history is pertinent for a history of ovarian cancer affecting her mother. She has had genetic testing and is BRAC negative  She is status post hysterectomy for endometriosis. She is followed by gynecology in frequently.  She has had a recent adjustment in her thyroid dosing due to a slight elevated TSH. Complaints today include left shoulder pain of 6 months duration She has had some increase in situational stress.  Her daughter has separated and has moved in with her pets  Wt Readings from Last 3 Encounters:  07/07/14 154 lb (69.854 kg)  12/22/13 145 lb (65.772 kg)  09/02/12 147 lb (66.679 kg)    Current Allergies:  ! SULF-10 (SULFACETAMIDE SODIUM)   Past Medical History:  Allergic rhinitis  Depression  Headache  Hyperlipidemia  Hypothyroidism  Impaired glucose tolerance  Past Surgical History:  hernia repair, and gynecologic surgery for endometriosis combined surgery, 1993  complete hysterectomy 2000  Colonoscopy age 73  Family History:  father died at age 88, brain cancer. Medical problems include diabetes Crohn's disease Graves' disease, dyslipidemia  mother, history of ovarian cancer history Parkinson's disease  sister with Crohn's colitis   Social History:  Married  two children, a son, age 45, daughter, who is married at age 15    Past Medical History  Diagnosis Date  . Allergy   . Depression   . Headache(784.0)   . Hyperlipidemia   . Thyroid disease     History   Social History  . Marital Status: Married    Spouse Name: N/A  . Number of Children: N/A  . Years of Education: N/A   Occupational History  . Not on file.   Social History Main Topics  . Smoking status: Never Smoker     . Smokeless tobacco: Not on file  . Alcohol Use: No  . Drug Use: No  . Sexual Activity: Not on file   Other Topics Concern  . Not on file   Social History Narrative    Past Surgical History  Procedure Laterality Date  . Hernia repair    . Abdominal hysterectomy      Family History  Problem Relation Age of Onset  . Cancer Mother     ovarian  . Parkinsonism Mother   . Cancer Father     brain  . Diabetes Father   . Crohn's disease Father   . Graves' disease Father   . Hyperlipidemia Father   . Crohn's disease Sister     Allergies  Allergen Reactions  . Sulfacetamide Sodium     Current Outpatient Prescriptions on File Prior to Visit  Medication Sig Dispense Refill  . levothyroxine (SYNTHROID, LEVOTHROID) 75 MCG tablet Take 1 tablet (75 mcg total) by mouth daily. 90 tablet 1  . tretinoin (RETIN-A) 0.05 % cream      No current facility-administered medications on file prior to visit.    There were no vitals taken for this visit.     Review of Systems  Constitutional: Negative for fever, appetite change, fatigue and unexpected weight change.  HENT: Negative for congestion, dental problem, ear pain, hearing loss, mouth sores, nosebleeds, sinus pressure, sore throat, tinnitus, trouble swallowing and voice change.   Eyes: Negative for photophobia, pain, redness and  visual disturbance.  Respiratory: Negative for cough, chest tightness and shortness of breath.   Cardiovascular: Negative for chest pain, palpitations and leg swelling.  Gastrointestinal: Negative for nausea, vomiting, abdominal pain, diarrhea, constipation, blood in stool, abdominal distention, anal bleeding and rectal pain.  Genitourinary: Negative for dysuria, urgency, frequency, hematuria, flank pain, vaginal bleeding, vaginal discharge, difficulty urinating, genital sores, vaginal pain, menstrual problem and pelvic pain.  Musculoskeletal: Negative for back pain, arthralgias and neck stiffness.        Left shoulder pain  Skin: Negative for rash.  Neurological: Negative for dizziness, syncope, speech difficulty, weakness, light-headedness, numbness and headaches.  Hematological: Negative for adenopathy. Does not bruise/bleed easily.  Psychiatric/Behavioral: Negative for suicidal ideas, behavioral problems, self-injury, dysphoric mood and agitation. The patient is nervous/anxious.        Objective:   Physical Exam  Constitutional: She is oriented to person, place, and time. She appears well-developed and well-nourished.  Blood pressure 110/80 Weight 154  HENT:  Head: Normocephalic and atraumatic.  Right Ear: External ear normal.  Left Ear: External ear normal.  Mouth/Throat: Oropharynx is clear and moist.  Eyes: Conjunctivae and EOM are normal.  Neck: Normal range of motion. Neck supple. No JVD present. No thyromegaly present.  Cardiovascular: Normal rate, regular rhythm, normal heart sounds and intact distal pulses.   No murmur heard. Pulmonary/Chest: Effort normal and breath sounds normal. She has no wheezes. She has no rales.  Abdominal: Soft. Bowel sounds are normal. She exhibits no distension and no mass. There is no tenderness. There is no rebound and no guarding.  Musculoskeletal: Normal range of motion. She exhibits no edema or tenderness.  Painful arc test at 90 Negative drop arm test Positive external rotation resistance test Negative.  Internal and external rotation lag test  Neurological: She is alert and oriented to person, place, and time. She has normal reflexes. No cranial nerve deficit. She exhibits normal muscle tone. Coordination normal.  Skin: Skin is warm and dry. No rash noted.  Psychiatric: She has a normal mood and affect. Her behavior is normal.          Assessment & Plan:  Preventive health examination Surgical menopause 16 years ago.  Calcium vitamin D supplements encouraged.  Will check a bone density Dyslipidemia stable Anxiety.  Exercise  regimen encouraged Hypothyroidism.  Recheck TSH in 3-4 weeks Left shoulder pain.  Probable rotator cuff tendinopathy.  Patient information and rehabilitation exercises dispensed

## 2014-07-07 NOTE — Progress Notes (Signed)
Pre visit review using our clinic review tool, if applicable. No additional management support is needed unless otherwise documented below in the visit note. 

## 2014-07-13 ENCOUNTER — Ambulatory Visit (INDEPENDENT_AMBULATORY_CARE_PROVIDER_SITE_OTHER)
Admission: RE | Admit: 2014-07-13 | Discharge: 2014-07-13 | Disposition: A | Discharge status: Home / Self Care | Payer: Managed Care, Other (non HMO) | Source: Ambulatory Visit | Attending: Internal Medicine | Admitting: Internal Medicine

## 2014-07-13 DIAGNOSIS — Z13820 Encounter for screening for osteoporosis: Secondary | ICD-10-CM

## 2014-07-13 DIAGNOSIS — Z0000 Encounter for general adult medical examination without abnormal findings: Secondary | ICD-10-CM

## 2014-07-28 ENCOUNTER — Other Ambulatory Visit: Payer: Managed Care, Other (non HMO)

## 2014-07-28 ENCOUNTER — Other Ambulatory Visit (INDEPENDENT_AMBULATORY_CARE_PROVIDER_SITE_OTHER): Payer: Managed Care, Other (non HMO)

## 2014-07-28 DIAGNOSIS — E039 Hypothyroidism, unspecified: Secondary | ICD-10-CM | POA: Diagnosis not present

## 2014-07-28 LAB — TSH: TSH: 0.53 u[IU]/mL (ref 0.35–4.50)

## 2014-08-04 ENCOUNTER — Encounter: Payer: Managed Care, Other (non HMO) | Admitting: Internal Medicine

## 2014-12-10 ENCOUNTER — Other Ambulatory Visit: Payer: Self-pay | Admitting: Internal Medicine

## 2015-02-06 ENCOUNTER — Encounter: Payer: Self-pay | Admitting: Internal Medicine

## 2015-02-06 ENCOUNTER — Ambulatory Visit (INDEPENDENT_AMBULATORY_CARE_PROVIDER_SITE_OTHER): Payer: Managed Care, Other (non HMO) | Admitting: Internal Medicine

## 2015-02-06 VITALS — BP 120/74 | HR 85 | Temp 98.3°F | Resp 20 | Ht 60.0 in | Wt 153.0 lb

## 2015-02-06 DIAGNOSIS — E039 Hypothyroidism, unspecified: Secondary | ICD-10-CM

## 2015-02-06 DIAGNOSIS — F4323 Adjustment disorder with mixed anxiety and depressed mood: Secondary | ICD-10-CM

## 2015-02-06 MED ORDER — ESCITALOPRAM OXALATE 10 MG PO TABS: 10.0000 mg | ORAL_TABLET | Freq: Every day | ORAL | Status: DC

## 2015-02-06 NOTE — Progress Notes (Signed)
Subjective:    Patient ID: Sarah Wagner, female    DOB: 1960-10-09, 55 y.o.   MRN: [id number]  HPI 55 year old patient who has a long history depression, which has been managed with Effexor.  For the past 2-3 weeks she has had increasing anxiety with excess worry.  Her daughter has recently been divorced and has moved in with the patient and husband.  In November she had a drug relapse and this has been a significant stressor.  The patient denies any frank depression but is been quite anxious at times tearful and has obsessive worry and especially about the status of her daughter.  She becomes quite distraught when her daughter fails to answer a phone call or text promptly.  She states that she sleeps well.  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  She states that she did fairly well during the Christmas holiday when per family was present, but complains of excessive worry when her son and daughter are away from home.  She does have a counselor and is scheduled for follow-up tomorrow  Past Medical History  Diagnosis Date  . Allergy   . Depression   . Headache(784.0)   . Hyperlipidemia   . Thyroid disease     Social History   Social History  . Marital Status: Married    Spouse Name: N/A  . Number of Children: N/A  . Years of Education: N/A   Occupational History  . Not on file.   Social History Main Topics  . Smoking status: Never Smoker   . Smokeless tobacco: Not on file  . Alcohol Use: No  . Drug Use: No  . Sexual Activity: Not on file   Other Topics Concern  . Not on file   Social History Narrative    Past Surgical History  Procedure Laterality Date  . Hernia repair    . Abdominal hysterectomy      Family History  Problem Relation Age of Onset  . Cancer Mother     ovarian  . Parkinsonism Mother   . Cancer Father     brain  . Diabetes Father   . Crohn's disease Father   . Graves' disease Father   . Hyperlipidemia Father   . Crohn's  disease Sister     Allergies  Allergen Reactions  . Sulfacetamide Sodium     Current Outpatient Prescriptions on File Prior to Visit  Medication Sig Dispense Refill  . levothyroxine (SYNTHROID, LEVOTHROID) 75 MCG tablet TAKE 1 TABLET (75 MCG TOTAL) BY MOUTH DAILY. 90 tablet 1  . simvastatin (ZOCOR) 20 MG tablet Take 1 tablet (20 mg total) by mouth at bedtime. 90 tablet 3  . tretinoin (RETIN-A) 0.05 % cream Apply topically at bedtime. 45 g 4  . venlafaxine XR (EFFEXOR XR) 75 MG 24 hr capsule Take 1 capsule (75 mg total) by mouth daily with breakfast. 90 capsule 3  . venlafaxine XR (EFFEXOR-XR) 150 MG 24 hr capsule Take 1 capsule (150 mg total) by mouth daily. 90 capsule 3   No current facility-administered medications on file prior to visit.    BP 120/74 mmHg  Pulse 85  Temp(Src) 98.3 F (36.8 C) (Oral)  Resp 20  Ht 5' (1.524 m)  Wt 153 lb (69.4 kg)  BMI 29.88 kg/m2  SpO2 98%      Review of Systems  Constitutional: Negative.   HENT: Negative for congestion, dental problem, hearing loss, rhinorrhea, sinus pressure, sore throat and tinnitus.   Eyes:  Negative for pain, discharge and visual disturbance.  Respiratory: Negative for cough and shortness of breath.   Cardiovascular: Negative for chest pain, palpitations and leg swelling.  Gastrointestinal: Negative for nausea, vomiting, abdominal pain, diarrhea, constipation, blood in stool and abdominal distention.  Genitourinary: Negative for dysuria, urgency, frequency, hematuria, flank pain, vaginal bleeding, vaginal discharge, difficulty urinating, vaginal pain and pelvic pain.  Musculoskeletal: Negative for joint swelling, arthralgias and gait problem.  Skin: Negative for rash.  Neurological: Negative for dizziness, syncope, speech difficulty, weakness, numbness and headaches.  Hematological: Negative for adenopathy.  Psychiatric/Behavioral: Positive for behavioral problems. Negative for dysphoric mood and agitation. The  patient is nervous/anxious.        Objective:   Physical Exam  Constitutional: She is oriented to person, place, and time. She appears well-developed and well-nourished. No distress.  Anxious, easily becomes tearful  Vital signs stable  HENT:  Head: Normocephalic.  Right Ear: External ear normal.  Left Ear: External ear normal.  Mouth/Throat: Oropharynx is clear and moist.  Eyes: Conjunctivae and EOM are normal. Pupils are equal, round, and reactive to light.  Neck: Neck supple. No thyromegaly present.  Cardiovascular: Normal rate, regular rhythm, normal heart sounds and intact distal pulses.   Pulmonary/Chest: Effort normal and breath sounds normal.  Abdominal: Soft. Bowel sounds are normal. She exhibits no mass. There is no tenderness.  Lymphadenopathy:    She has no cervical adenopathy.  Neurological: She is alert and oriented to person, place, and time.  No tremor  Skin: Skin is warm and dry. No rash noted.  Psychiatric: She has a normal mood and affect. Her behavior is normal.          Assessment & Plan:   Adjustment disorder with anxious mood.  We'll taper and discontinue Effexor and substitute a pure SSRI.  She is scheduled for follow-up with her counselor tomorrow. Return here in 4-6 weeks Patient will report any clinical worsening Consider psychiatric referral depending on clinical response

## 2015-02-06 NOTE — Patient Instructions (Addendum)
Lexapro 10 mg daily for 30 days.  Then 20 mg daily Effexor 150 mg daily for 2 weeks, then 75 mg daily for 2 weeks, then discontinue  Return in 4-6 weeks for follow-up or sooner if needed  Call for any clinical worsening

## 2015-02-06 NOTE — Progress Notes (Signed)
Pre visit review using our clinic review tool, if applicable. No additional management support is needed unless otherwise documented below in the visit note. 

## 2015-03-02 ENCOUNTER — Ambulatory Visit (INDEPENDENT_AMBULATORY_CARE_PROVIDER_SITE_OTHER): Payer: Managed Care, Other (non HMO) | Admitting: Internal Medicine

## 2015-03-02 ENCOUNTER — Telehealth: Payer: Self-pay | Admitting: Internal Medicine

## 2015-03-02 ENCOUNTER — Encounter: Payer: Self-pay | Admitting: Internal Medicine

## 2015-03-02 VITALS — BP 136/90 | HR 79 | Temp 98.5°F | Resp 20 | Ht 60.0 in | Wt 153.0 lb

## 2015-03-02 DIAGNOSIS — F4323 Adjustment disorder with mixed anxiety and depressed mood: Secondary | ICD-10-CM

## 2015-03-02 DIAGNOSIS — E039 Hypothyroidism, unspecified: Secondary | ICD-10-CM

## 2015-03-02 MED ORDER — BUSPIRONE HCL 10 MG PO TABS: 10.0000 mg | ORAL_TABLET | Freq: Two times a day (BID) | ORAL | Status: DC

## 2015-03-02 MED ORDER — ESCITALOPRAM OXALATE 20 MG PO TABS: 20.0000 mg | ORAL_TABLET | Freq: Every day | ORAL | Status: DC

## 2015-03-02 NOTE — Telephone Encounter (Signed)
Patient Name: Robynn Justiss  DOB: [birth year]    Initial Comment Caller states changing meds from Effexer to Lexapro, c/o severe anxiety attacks    Nurse Assessment  Nurse: Vickey Sages, RN, Jacquilin Date/Time (Eastern Time): 03/02/2015 8:27:59 AM  Confirm and document reason for call. If symptomatic, describe symptoms. You must click the next button to save text entered. ---Caller states changing meds from Effexer to Lexapro, c/o severe anxiety attacks  Has the patient traveled out of the country within the last 30 days? ---No  Does the patient have any new or worsening symptoms? ---Yes  Will a triage be completed? ---Yes  Related visit to physician within the last 2 weeks? ---No  Does the PT have any chronic conditions? (i.e. diabetes, asthma, etc.) ---Yes  List chronic conditions. ---anxiety and depression  Is this a behavioral health or substance abuse call? ---Yes  Are you having any thoughts or feelings of harming or killing yourself or someone else? ---No  Are you currently experiencing any physical discomfort that you think may be related to the use of alcohol or other drugs? (use substance abuse or alcohol abuse guidelines. These include withdrawal symptoms) ---No  Do you worry that you may be hearing or seeing things that others do not? ---No  Do you take medications for your condition(s)? ---Yes     Guidelines    Guideline Title Affirmed Question Affirmed Notes  Anxiety and Panic Attack Symptoms interfere with work or school    Final Disposition User   See Physician within 24 Hours Atkins, RN, Jacquilin    Comments  Appointment made with PCP at 11   Referrals  REFERRED TO PCP OFFICE   Disagree/Comply: Comply

## 2015-03-02 NOTE — Progress Notes (Signed)
   Subjective:    Patient ID: Sarah Wagner, female    DOB: 11-18-1960, 55 y.o.   MRN: [id number]  HPI  55 year old patient who has a history of mixed anxiety and depressed mood.  She was seen about 1 month ago complaining of increasing anxiety, excessive worry.  She had been on Effexor 150 mg and this medication has been tapered and discontinued.  Lexapro was substituted and tomorrow is to start a 20 mg dose She continues to be followed by a counselor.  She remains depressed with little interest in daily activities.  She remains quite anxious but slightly improved   Past Medical History  Diagnosis Date  . Allergy   . Depression   . Headache(784.0)   . Hyperlipidemia   . Thyroid disease     Social History   Social History  . Marital Status: Married    Spouse Name: N/A  . Number of Children: N/A  . Years of Education: N/A   Occupational History  . Not on file.   Social History Main Topics  . Smoking status: Never Smoker   . Smokeless tobacco: Not on file  . Alcohol Use: No  . Drug Use: No  . Sexual Activity: Not on file   Other Topics Concern  . Not on file   Social History Narrative    Past Surgical History  Procedure Laterality Date  . Hernia repair    . Abdominal hysterectomy      Family History  Problem Relation Age of Onset  . Cancer Mother     ovarian  . Parkinsonism Mother   . Cancer Father     brain  . Diabetes Father   . Crohn's disease Father   . Graves' disease Father   . Hyperlipidemia Father   . Crohn's disease Sister     Allergies  Allergen Reactions  . Sulfacetamide Sodium     Current Outpatient Prescriptions on File Prior to Visit  Medication Sig Dispense Refill  . levothyroxine (SYNTHROID, LEVOTHROID) 75 MCG tablet TAKE 1 TABLET (75 MCG TOTAL) BY MOUTH DAILY. 90 tablet 1  . simvastatin (ZOCOR) 20 MG tablet Take 1 tablet (20 mg total) by mouth at bedtime. 90 tablet 3  . tretinoin (RETIN-A) 0.05 % cream Apply topically at bedtime.  45 g 4   No current facility-administered medications on file prior to visit.    BP 136/90 mmHg  Pulse 79  Temp(Src) 98.5 F (36.9 C) (Oral)  Resp 20  Ht 5' (1.524 m)  Wt 153 lb (69.4 kg)  BMI 29.88 kg/m2  SpO2 98%     Review of Systems  Psychiatric/Behavioral: Positive for dysphoric mood. The patient is nervous/anxious.        Objective:   Physical Exam  Constitutional: She appears well-developed and well-nourished. No distress.  Psychiatric: She has a normal mood and affect. Her behavior is normal. Judgment and thought content normal.          Assessment & Plan:   Mixed anxiety and depressed mood.  Will continue Lexapro 20 mg daily.  This dose will start tomorrow.  Effexor has been tapered and discontinued.  We'll add BuSpar 10 mg twice a day.  Continue counseling.  The patient has been asked to call the office if no clinical improvement or any worsening for psychiatric referral.  If the patient continues to improve, we'll reassess in 2 months or as needed

## 2015-03-02 NOTE — Progress Notes (Signed)
Pre visit review using our clinic review tool, if applicable. No additional management support is needed unless otherwise documented below in the visit note. 

## 2015-03-02 NOTE — Patient Instructions (Signed)
Please call for a referral if you fail to show significant improvement

## 2015-03-02 NOTE — Telephone Encounter (Signed)
Noted  

## 2015-03-02 NOTE — Telephone Encounter (Signed)
FYI - Patient has appt with Dr. Kirtland Bouchard today at 11:00

## 2015-04-30 ENCOUNTER — Encounter (HOSPITAL_COMMUNITY): Payer: Self-pay | Admitting: Oncology

## 2015-04-30 ENCOUNTER — Emergency Department (HOSPITAL_COMMUNITY)
Admission: EM | Admit: 2015-04-30 | Discharge: 2015-04-30 | Disposition: A | Discharge status: Home / Self Care | Payer: Managed Care, Other (non HMO) | Attending: Emergency Medicine | Admitting: Emergency Medicine

## 2015-04-30 DIAGNOSIS — F419 Anxiety disorder, unspecified: Secondary | ICD-10-CM | POA: Diagnosis not present

## 2015-04-30 DIAGNOSIS — F329 Major depressive disorder, single episode, unspecified: Secondary | ICD-10-CM | POA: Insufficient documentation

## 2015-04-30 DIAGNOSIS — E785 Hyperlipidemia, unspecified: Secondary | ICD-10-CM | POA: Insufficient documentation

## 2015-04-30 DIAGNOSIS — E079 Disorder of thyroid, unspecified: Secondary | ICD-10-CM | POA: Diagnosis not present

## 2015-04-30 DIAGNOSIS — Z79899 Other long term (current) drug therapy: Secondary | ICD-10-CM | POA: Insufficient documentation

## 2015-04-30 DIAGNOSIS — Z008 Encounter for other general examination: Secondary | ICD-10-CM | POA: Diagnosis present

## 2015-04-30 LAB — ACETAMINOPHEN LEVEL: Acetaminophen (Tylenol), Serum: <10 ug/mL — ABNORMAL LOW (ref 10–30)

## 2015-04-30 LAB — COMPREHENSIVE METABOLIC PANEL
ALK PHOS: 66 U/L (ref 38–126)
ALT: 15 U/L (ref 14–54)
AST: 16 U/L (ref 15–41)
Albumin: 4 g/dL (ref 3.5–5.0)
Anion gap: 6 (ref 5–15)
BUN: 15 mg/dL (ref 6–20)
CALCIUM: 9.3 mg/dL (ref 8.9–10.3)
CO2: 27 mmol/L (ref 22–32)
CREATININE: 0.89 mg/dL (ref 0.44–1.00)
Chloride: 110 mmol/L (ref 101–111)
Glucose, Bld: 144 mg/dL — ABNORMAL HIGH (ref 65–99)
Potassium: 3.4 mmol/L — ABNORMAL LOW (ref 3.5–5.1)
Sodium: 143 mmol/L (ref 135–145)
Total Bilirubin: 0.3 mg/dL (ref 0.3–1.2)
Total Protein: 7.5 g/dL (ref 6.5–8.1)

## 2015-04-30 LAB — CBC
HCT: 41.4 % (ref 36.0–46.0)
HEMOGLOBIN: 13.5 g/dL (ref 12.0–15.0)
MCH: 28.1 pg (ref 26.0–34.0)
MCHC: 32.6 g/dL (ref 30.0–36.0)
MCV: 86.3 fL (ref 78.0–100.0)
Platelets: 315 10*3/uL (ref 150–400)
RBC: 4.8 MIL/uL (ref 3.87–5.11)
RDW: 13.5 % (ref 11.5–15.5)
WBC: 9.9 10*3/uL (ref 4.0–10.5)

## 2015-04-30 LAB — RAPID URINE DRUG SCREEN, HOSP PERFORMED
Amphetamines: NOT DETECTED
BARBITURATES: NOT DETECTED
Benzodiazepines: NOT DETECTED
Cocaine: NOT DETECTED
OPIATES: NOT DETECTED
TETRAHYDROCANNABINOL: NOT DETECTED

## 2015-04-30 LAB — ETHANOL: Alcohol, Ethyl (B): <5 mg/dL (ref <5)

## 2015-04-30 LAB — SALICYLATE LEVEL

## 2015-04-30 NOTE — ED Provider Notes (Signed)
CSN: [id number]     Arrival date & time 04/30/15  1932 History   First MD Initiated Contact with Patient 04/30/15 2022     Chief Complaint  Patient presents with  . Medical Clearance     (Consider location/radiation/quality/duration/timing/severity/associated sxs/prior Treatment) Patient is a 55 y.o. female presenting with anxiety. The history is provided by the patient. No language interpreter was used.  Anxiety This is a recurrent problem. The current episode started more than 1 month ago. The problem occurs constantly. The problem has been gradually worsening. Associated symptoms include nausea. Pertinent negatives include no vomiting. Nothing aggravates the symptoms. She has tried nothing for the symptoms. The treatment provided no relief.   Pt reports she has a history of anxiety and depression.  Pt is in counseling.  She is on lexapro and buspar. Pt reports increased anxiety because of problems her daughter has been drinking and has been in an abusive relationship.    Pt tells me currently she does not want to be hospitalized.  Pt reports concern that her daughter will feel it is her fault. Past Medical History  Diagnosis Date  . Allergy   . Depression   . Headache(784.0)   . Hyperlipidemia   . Thyroid disease    Past Surgical History  Procedure Laterality Date  . Hernia repair    . Abdominal hysterectomy     Family History  Problem Relation Age of Onset  . Cancer Mother     ovarian  . Parkinsonism Mother   . Cancer Father     brain  . Diabetes Father   . Crohn's disease Father   . Graves' disease Father   . Hyperlipidemia Father   . Crohn's disease Sister    Social History  Substance Use Topics  . Smoking status: Never Smoker   . Smokeless tobacco: Never Used  . Alcohol Use: No   OB History    No data available     Review of Systems  Gastrointestinal: Positive for nausea. Negative for vomiting.  All other systems reviewed and are  negative.     Allergies  Sulfacetamide sodium  Home Medications   Prior to Admission medications   Medication Sig Start Date End Date Taking? Authorizing Provider  busPIRone (BUSPAR) 10 MG tablet Take 1 tablet (10 mg total) by mouth 2 (two) times daily. 03/02/15  Yes Gordy SaversPeter F Kwiatkowski, MD  escitalopram (LEXAPRO) 20 MG tablet Take 1 tablet (20 mg total) by mouth daily. 03/02/15  Yes Gordy SaversPeter F Kwiatkowski, MD  levothyroxine (SYNTHROID, LEVOTHROID) 75 MCG tablet TAKE 1 TABLET (75 MCG TOTAL) BY MOUTH DAILY. 12/12/14  Yes Gordy SaversPeter F Kwiatkowski, MD  oxymetazoline (AFRIN) 0.05 % nasal spray Place 1 spray into both nostrils 2 (two) times daily as needed for congestion.   Yes Historical Provider, MD  simvastatin (ZOCOR) 20 MG tablet Take 1 tablet (20 mg total) by mouth at bedtime. 07/07/14  Yes Gordy SaversPeter F Kwiatkowski, MD  tretinoin (RETIN-A) 0.05 % cream Apply topically at bedtime. 07/07/14  Yes Gordy SaversPeter F Kwiatkowski, MD   BP 124/76 mmHg  Pulse 78  Temp(Src) 97.6 F (36.4 C) (Oral)  Resp 20  SpO2 96% Physical Exam  Constitutional: She is oriented to person, place, and time. She appears well-developed and well-nourished.  HENT:  Head: Normocephalic and atraumatic.  Eyes: Conjunctivae and EOM are normal. Pupils are equal, round, and reactive to light.  Neck: Normal range of motion.  Cardiovascular: Normal rate and normal heart sounds.   Pulmonary/Chest: Effort  normal.  Abdominal: She exhibits no distension.  Musculoskeletal: Normal range of motion.  Neurological: She is alert and oriented to person, place, and time.  Psychiatric: She has a normal mood and affect.  Nursing note and vitals reviewed.   ED Course  Procedures (including critical care time) Labs Review Labs Reviewed  COMPREHENSIVE METABOLIC PANEL - Abnormal; Notable for the following:    Potassium 3.4 (*)    Glucose, Bld 144 (*)    All other components within normal limits  ACETAMINOPHEN LEVEL - Abnormal; Notable for the  following:    Acetaminophen (Tylenol), Serum <10 (*)    All other components within normal limits  ETHANOL  SALICYLATE LEVEL  CBC  URINE RAPID DRUG SCREEN, HOSP PERFORMED    Imaging Review No results found. I have personally reviewed and evaluated these images and lab results as part of my medical decision-making.   EKG Interpretation None      MDM  TTS consulted.  Pt does not want to be admitted but I wanted full evaluation to make sure they do not feel like patient is at risk as well as to provide options for assistance.  Pt does not meet inpatient criteria per the provider on call.   They provided resources for outpatient treatment for the patient.     Final diagnoses:  Anxiety    An After Visit Summary was printed and given to the patient.    Lonia Skinner [place], PA-C 04/30/15 2329  Lavera Guise, MD 05/01/15 [phone]

## 2015-04-30 NOTE — BH Assessment (Signed)
Assessment completed. Consulted with Alberteen SamFran Hobson, FNP-BC who states that patient does not meet inpatient criteria and recommends patient be discharged to follow up with a psychiatrist.   Davina PokeJoVea Wallis Spizzirri, LCSW Therapeutic Triage Specialist [facility] 04/30/2015 9:57 PM

## 2015-04-30 NOTE — Discharge Instructions (Addendum)
Patient to follow up with a psychiatrist as soon as possible:  [facility]  Psychiatrists  [facility]   [facility] [phone]      [phone]  Dr. Duane BostonGerald Plovsky     [facility] [phone]      [phone]  Dr. Thedore MinsMojeed Akintayo      [phone]       Therapists  [facility] [phone]      [phone]  Sydell AxonKerrie Johnson, [facility]     [facility] [phone]      [phone]   [facility]    [facility] [phone]      [phone]  [facility]     [facility] [phone]      [phone]   Thea SilversmithJennifer Bressler, [facility]                         [facility] [address]                         [place] [address]                         [phone] [address] [phone]                           [place], [place] [address]                  [phone]      [facility]    Assertive  Mobile Crisis Care Unit    [facility] [phone]     [address], [place], [place]        [phone]  These referrals have been provided to you as appropriate for your clinical needs while taking into account your financial concerns. Please be aware that agencies, practitioners and insurance companies sometimes change contracts. When calling to make an appointment have your insurance information available so the professional you are going to see can confirm whether they are covered by your plan. Take this form with you in case the person you are seeing needs a copy or to contact us.
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
## 2015-04-30 NOTE — BH Assessment (Signed)
Met with patient to discuss disposition. Patient reports that she will follow-up with a psychiatrist.  Patient states that she feels safe going home and following up. Patient reports that she will look into the IOP program once school is out.   Informed EDP of disposition and that resources were provided to patient and put in AVS.   Rosalin Hawking, LCSW Therapeutic Triage Specialist [place] 04/30/2015 10:31 PM

## 2015-06-08 ENCOUNTER — Other Ambulatory Visit (HOSPITAL_COMMUNITY): Payer: 59 | Attending: Psychiatry | Admitting: Psychiatry

## 2015-06-08 ENCOUNTER — Encounter (HOSPITAL_COMMUNITY): Payer: Self-pay | Admitting: Psychiatry

## 2015-06-08 DIAGNOSIS — F331 Major depressive disorder, recurrent, moderate: Secondary | ICD-10-CM | POA: Diagnosis not present
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
## 2015-06-09 ENCOUNTER — Other Ambulatory Visit (HOSPITAL_COMMUNITY): Payer: 59 | Admitting: Psychiatry

## 2015-06-09 DIAGNOSIS — F331 Major depressive disorder, recurrent, moderate: Secondary | ICD-10-CM

## 2015-06-09 NOTE — Progress Notes (Signed)
    Daily Group Progress Note  Program: IOP  Group Time: 9:00-12:00  Participation Level: Active  Behavioral Response: Appropriate  Type of Therapy:  Group Therapy  Summary of Progress: Client was new to the group.  Client shared a small part of her story with the group; stress of daughter struggling with drug addiction.  Client shared she "takes on others' problems as if they are her own" which makes her stressed and depressed.  Client wants to work on this problem.     Boneta LucksJennifer Brown, Ph.D., [facility]

## 2015-06-09 NOTE — Progress Notes (Signed)
    Daily Group Progress Note  Program: IOP  Group Time: 9:00-12:00  Participation Level: Active  Behavioral Response: Appropriate  Type of Therapy:  Group Therapy  Summary of Progress: Pt. Presented as tearful, alert, engaged in the group process. Pt. Shared with the group her sadness and loss of control related to her adult daughter who is addicted to heroin. Pt. Received feedback from other group members regarding the grief process and awareness of patterns of enabling.     Shaune PollackBrown, Jennifer B, LPC

## 2015-06-12 ENCOUNTER — Other Ambulatory Visit: Payer: Self-pay | Admitting: Internal Medicine

## 2015-06-13 ENCOUNTER — Other Ambulatory Visit (HOSPITAL_COMMUNITY): Payer: 59 | Admitting: Psychiatry

## 2015-06-13 DIAGNOSIS — F331 Major depressive disorder, recurrent, moderate: Secondary | ICD-10-CM

## 2015-06-13 NOTE — Progress Notes (Signed)
    Daily Group Progress Note  Program: IOP  Group Time: 10:30-12:00  Participation Level: Active  Behavioral Response: Appropriate  Type of Therapy:  Group Therapy  Summary of Progress: Pt participated in the discussion on stages of grief and depression. Pt was able to give good insight about the stages of grief and how it can turn into depression. She also gave good insight into what coping skills had worked for her in the past. Pt was also able to give good feedback to another pt on the stages of grief. The pt participated well in the intervention.      Tyjuan Demetro S, Licensed Cli

## 2015-06-14 ENCOUNTER — Other Ambulatory Visit (HOSPITAL_COMMUNITY): Payer: 59

## 2015-06-14 NOTE — Progress Notes (Signed)
    Daily Group Progress Note  Program: IOP  Group Time: 9:00-10:30  Participation Level: Active  Behavioral Response: Appropriate  Type of Therapy:  Group Therapy  Summary of Progress: Pt. Presented as quiet, appropriately tearful, depressed. Pt. Stated "I've had better days". Pt. Shared that she continues to feel the fear of losing her daughter to heroin addiction and is consumed with thoughts of trying to save her. Pt. Participated in discussion of patterns of co-dependence and enabling that are not helping her daughter and contributing to her depression.     Shaune PollackBrown, Jennifer B, LPC

## 2015-06-15 ENCOUNTER — Other Ambulatory Visit (HOSPITAL_COMMUNITY): Payer: 59 | Attending: Psychiatry | Admitting: Psychiatry

## 2015-06-15 DIAGNOSIS — F331 Major depressive disorder, recurrent, moderate: Secondary | ICD-10-CM | POA: Insufficient documentation

## 2015-06-15 DIAGNOSIS — F411 Generalized anxiety disorder: Secondary | ICD-10-CM | POA: Insufficient documentation

## 2015-06-16 ENCOUNTER — Other Ambulatory Visit (HOSPITAL_COMMUNITY): Payer: 59 | Admitting: Psychiatry

## 2015-06-16 DIAGNOSIS — F331 Major depressive disorder, recurrent, moderate: Secondary | ICD-10-CM | POA: Diagnosis not present

## 2015-06-16 NOTE — Progress Notes (Signed)
    Daily Group Progress Note  Program: IOP  Group Time: 9:00-12:00  Participation Level: Active  Behavioral Response: Appropriate  Type of Therapy:   group therapy   Summary of Progress: Client discussed her anxiety and depression related to her daughter's substance abuse issues.  Client was challenged to separate her own mental health from her daughter's substance abuse.   Client was reminded that she cannot control her daughter's actions, but can take control of her own life.   Boneta LucksJennifer Shayley Medlin, Ph.D., [facility]

## 2015-06-16 NOTE — Progress Notes (Signed)
    Daily Group Progress Note  Program: IOP  Group Time: 9:00-10:30  Participation Level: Active  Behavioral Response: Appropriate  Type of Therapy:  Group Therapy  Summary of Progress: Pt. Presented as talkative, engaged in the group process. Pt. Shared that she had a "great afternoon yesterday" was able to cook, do laundry, work in her yard. Pt. Shared that her mantra was "nobody needs me today" which helped her to set healthy boundaries and focus on her self-care.     Group Time: 10:30-12:00  Participation Level:  Active  Behavioral Response: Appropriate  Type of Therapy: Psycho-education Group  Summary of Progress: Pt. Participated in grief and loss with the Chaplain.  Shaune PollackBrown, Jennifer B, LPC

## 2015-06-19 ENCOUNTER — Other Ambulatory Visit (HOSPITAL_COMMUNITY): Payer: 59 | Admitting: Psychiatry

## 2015-06-19 DIAGNOSIS — F331 Major depressive disorder, recurrent, moderate: Secondary | ICD-10-CM | POA: Diagnosis not present

## 2015-06-20 ENCOUNTER — Other Ambulatory Visit (HOSPITAL_COMMUNITY): Payer: 59 | Admitting: Psychiatry

## 2015-06-20 DIAGNOSIS — F331 Major depressive disorder, recurrent, moderate: Secondary | ICD-10-CM

## 2015-06-20 NOTE — Progress Notes (Signed)
    Daily Group Progress Note  Program: IOP  Group Time: 9:00-12:00  Participation Level: Active  Behavioral Response: Appropriate  Type of Therapy:  Group Therapy  Summary of Progress: Group met with pharmacist.  Group discussed values and completed "fallout shelter" activity; client identified family as her number one value.        Nancie Neas, LPC

## 2015-06-21 ENCOUNTER — Other Ambulatory Visit (HOSPITAL_COMMUNITY): Payer: 59 | Admitting: Psychiatry

## 2015-06-21 DIAGNOSIS — F331 Major depressive disorder, recurrent, moderate: Secondary | ICD-10-CM

## 2015-06-21 NOTE — Progress Notes (Signed)
    Daily Group Progress Note  Program: IOP  Group Time: 9:00-10:45  Participation Level: Active  Behavioral Response: Engaged, Responsive  Type: Group  Summary: Pt. mentioned that her daughter is a trigger for her. She mentioned that she was upset yesterday because her daughter called. She mentioned feeling tense, because she is expecting her daughter to tell her something bad. Therapist suggested letting the daughter's calls go to voicemail, instead of answering each call. She stated that she could answer the call when she is emotionally ready. Therapist also addressed the shadow of the fixer, can be someone who likes to be in control. She stated that we have to let go of that control, because ultimately we only have control over ourselves.  Shaune PollackBrown, Jennifer B, LPC

## 2015-06-21 NOTE — Progress Notes (Signed)
Daily Group Progress Note  Program: IOP  Group Time: 10:30-12:00  Participation Level: Active  Behavioral Response: Appropriate  Type of Therapy:  Group Therapy  Summary of Progress: Pt presented as talkative, engaged in the group process. Pt participated in discussion about the use of mindfulness at the beginning of each day. This will set up her day in a positive way. Pt shared her mornings are filled with anxiety, as she approaches the rest of her day.

## 2015-06-21 NOTE — Progress Notes (Signed)
Daily Group Progress Note  Program: IOP  Group Time: 10:45-12:00pm  Participation Level: Active  Behavioral Response: Appropriate  Type of Therapy:  Group Therapy  Summary of Progress: Pt presented as talkative, engaged in the group process. Pt participated in a discussion about self-esteem. Pt was able to identify, through an activity, her overall subjective emotional evaluation of her own worth. Pt was encouraged to continue to work on her self esteem by using positive affirmations.  

## 2015-06-21 NOTE — Progress Notes (Signed)
    Daily Group Progress Note  Program: IOP  Group Time: 9:00-10:30  Participation Level: Active  Behavioral Response: Appropriate  Type of Therapy:  Group Therapy  Summary of Progress: Pt. Presented as talkative, engaged in group process, receptive of feedback from others about patterns of co-dependency. Participated in discussion about the development of mindfulness and use to manage depression. Pt. Was encouraged to do daily check-in of ability and motivation and challenge self to engage in the day based on daily assessment.      Shaune PollackBrown, Jennifer B, LPC

## 2015-06-22 ENCOUNTER — Other Ambulatory Visit (HOSPITAL_COMMUNITY): Payer: 59

## 2015-06-23 ENCOUNTER — Other Ambulatory Visit (HOSPITAL_COMMUNITY): Payer: 59 | Admitting: Psychiatry

## 2015-06-23 DIAGNOSIS — F331 Major depressive disorder, recurrent, moderate: Secondary | ICD-10-CM | POA: Diagnosis not present

## 2015-06-23 NOTE — Progress Notes (Signed)
    Daily Group Progress Note  Program: IOP  Group Time: 9:00-10:30  Participation Level: Active  Behavioral Response: Appropriate  Type of Therapy:  Group Therapy  Summary of Progress: Pt. Presented as talkative and engaged in the group process. Pt. Continues to be challenged by co-dependency and need to save her daughter. Pt. Was appropriately tearful when discussing her feelings of loss of control and powerlessness about saving her daughter.     Group Time: 10:30-12:00  Participation Level:  Active  Behavioral Response: Appropriate  Type of Therapy: Psycho-education Group  Summary of Progress: Pt. Participated in grief and loss with the Chaplain.  Shaune PollackBrown, Jennifer B, LPC

## 2015-06-26 ENCOUNTER — Other Ambulatory Visit (HOSPITAL_COMMUNITY): Payer: 59 | Admitting: Psychiatry

## 2015-06-26 DIAGNOSIS — F331 Major depressive disorder, recurrent, moderate: Secondary | ICD-10-CM

## 2015-06-26 NOTE — Progress Notes (Signed)
    Daily Group Progress Note  Program: IOP Group Time: 9:00-12:00   Participation Level:  active   Behavioral Response: engaged   Type of Therapy:   group therapy   Summary of Progress: Patient was able to identify her codependence with daughter as a source of her own depression.  Patient understands that separating her needs and disorder from her daughter's is difficult, but is willing to work towards that goal.  Group discussed understanding diagnoses, stigmas that accompany specific diagnoses, and becoming comfortable with diagnoses.  Therapist encouraged client to practice self-care techniques, and provided group with a list of possible activities.  Shaune PollackBrown, Jennifer B, LPC

## 2015-06-27 ENCOUNTER — Other Ambulatory Visit (HOSPITAL_COMMUNITY): Payer: 59 | Admitting: Licensed Clinical Social Worker

## 2015-06-27 ENCOUNTER — Encounter (HOSPITAL_COMMUNITY): Payer: Self-pay | Admitting: Licensed Clinical Social Worker

## 2015-06-27 DIAGNOSIS — F411 Generalized anxiety disorder: Secondary | ICD-10-CM

## 2015-06-27 DIAGNOSIS — F331 Major depressive disorder, recurrent, moderate: Secondary | ICD-10-CM

## 2015-06-27 MED ORDER — LORAZEPAM 0.5 MG PO TABS: 0.5000 mg | ORAL_TABLET | Freq: Three times a day (TID) | ORAL | Status: AC | PRN

## 2015-06-27 NOTE — Progress Notes (Signed)
    Daily Group Progress Note  Program: IOP Group Time: 9:15-10:45  Participation Level: Active   Behavioral Response: Engaged, Responsive  Type: Group Therapy  Summary: Patient demonstrated a theme of devastation today. She was tearful, and upset about her daughter leaving the treatment center and lying to her. She felt paralyzed by not being able to help her daughter. Therapist addressed the hurt that she was feeling, and also talked about how to love her daughter differently. Therapist stated that you can still care deeply, but show love in a different form.      Shaune PollackBrown, Dory Verdun B, LPC

## 2015-06-27 NOTE — Progress Notes (Signed)
Daily Group Progress Note  Program: IOP  Group Time: 10:45-12:00pm  Participation Level: Active  Behavioral Response: Appropriate  Type of Therapy:  Psychotherapy/ Group Therapy  Summary of Progress: Pt presented as sad and tearful. Pt  participated in a discussion about "The power of No." Pt was able to identify all daily activities required and how exhausting that can be. It was suggested to pt that feeling overwhelmed continuously can lead to burn out. The opportunity to replenish mind, body and spirit can lead to a less stressful life.

## 2015-06-27 NOTE — Progress Notes (Signed)
Patient ID: Sarah PaganRebecca Wagner, female   DOB: [birth year], 55 y.o.   MRN: [id number] Anxiety not responding to Buspar or to group therapy.  Taking lorazepam 0.5 mg hs.  Increase to 0.5 mg tid to see if that helps temporarily.  Did not call in any meds as she already has some.

## 2015-06-28 ENCOUNTER — Other Ambulatory Visit (HOSPITAL_COMMUNITY): Payer: 59 | Admitting: Licensed Clinical Social Worker

## 2015-06-28 DIAGNOSIS — F331 Major depressive disorder, recurrent, moderate: Secondary | ICD-10-CM | POA: Diagnosis not present

## 2015-06-28 DIAGNOSIS — F411 Generalized anxiety disorder: Secondary | ICD-10-CM

## 2015-06-28 NOTE — Progress Notes (Signed)
    Daily Group Progress Note  Program: IOP   Group Time: 9:15-10:45 Participation Level: active Behavioral Response: engaged, responsive Type: Group Therapy Summary: Patient's presence was greatly improved compared to last session. As indicated by having a meaningful conversation with her son, enjoying shopping at [employer]. Patient was able to recognize that her depression was better today. Pt. Presented with less tearfulness. Therapist encouraged patient to continue the process of letting go of co-dependence.      Sarah Wagner, Sarah Wagner, LPC

## 2015-06-28 NOTE — Progress Notes (Signed)
Daily Group Progress Note  Program: IOP  Group Time: 10:45-12:00pm  Participation Level: Active  Behavioral Response: Appropriate  Type of Therapy:  Psychotherapy  Summary of Progress:  Pt participated in a discussion on positive affirmations. Pt participated in an activity "I am okay" by reading one positive affirmation with explanation of thoughts/feelings surrounding the affirmation read. Pt was challenged to read the affirmation every day to overcome self-sabotaging negative thoughts. 

## 2015-06-29 ENCOUNTER — Other Ambulatory Visit (HOSPITAL_COMMUNITY): Payer: 59 | Admitting: Psychiatry

## 2015-06-29 DIAGNOSIS — F411 Generalized anxiety disorder: Secondary | ICD-10-CM

## 2015-06-29 DIAGNOSIS — F331 Major depressive disorder, recurrent, moderate: Secondary | ICD-10-CM | POA: Diagnosis not present

## 2015-06-29 MED ORDER — CITALOPRAM HYDROBROMIDE 40 MG PO TABS: 40.0000 mg | ORAL_TABLET | Freq: Every day | ORAL | Status: DC

## 2015-06-29 NOTE — Progress Notes (Signed)
    Daily Group Progress Note  Program: IOP  Group Time: 9:00-12:00   Participation Level:  active   Behavioral Response:  engaged   Type of Therapy:   group therapy   Summary of Progress: Client was visibly distraught at the beginning of the group.  She became more open as group progressed. Client stated she was "not the greatest today, but I'm trying."  The client is grieving the dream of what her world should look like without her daughter's addiction.  Therapist encouraged client to set time for herself in the morning to shift her focus so the day is less chaotic.  Therapist also encouraged client to stay in the moment and surrender expectations of what should be.  Client was also encouraged to focus on the good in her life, like her son.  Shaune PollackBrown, Haven Pylant B, LPC

## 2015-06-29 NOTE — Progress Notes (Signed)
Patient ID: Sarah PaganRebecca Wagner, female   DOB: 1960/08/09, 55 y.o.   MRN: [id number] Discharge Note  Patient:  Sarah PaganRebecca Wagner is an 55 y.o., female DOB:  1960/08/09  Date of Admission:  5/25/201 Date of Discharge:  06/30/2015 Reason for Admission:anxiety and depression  IOP Course: Sarah Wagner attended and participated.  She remains depressed but less so.  Anxiety particularly around the daughter's problems persists.  Overall she found the program helpful.  She was already in th process of switching from escitalopram to citalopram and that was completed while she was here.  The citalopram was increased to 40 mg today before discharge tomorrow.  Mental Status at Discharge: not suicidal, though still moderately depressed and anxious  Lab Results: No results found for this or any previous visit (from the past 48 hour(s)).   Current outpatient prescriptions:  .  citalopram (CELEXA) 40 MG tablet, Take 1 tablet (40 mg total) by mouth daily., Disp: 30 tablet, Rfl: 2 .  levothyroxine (SYNTHROID, LEVOTHROID) 75 MCG tablet, TAKE 1 TABLET (75 MCG TOTAL) BY MOUTH DAILY., Disp: 90 tablet, Rfl: 0 .  LORazepam (ATIVAN) 0.5 MG tablet, Take 1 tablet (0.5 mg total) by mouth every 8 (eight) hours as needed for anxiety., Disp: 60 tablet, Rfl: 0 .  oxymetazoline (AFRIN) 0.05 % nasal spray, Place 1 spray into both nostrils 2 (two) times daily as needed for congestion., Disp: , Rfl:  .  simvastatin (ZOCOR) 20 MG tablet, Take 1 tablet (20 mg total) by mouth at bedtime., Disp: 90 tablet, Rfl: 3 .  tretinoin (RETIN-A) 0.05 % cream, Apply topically at bedtime., Disp: 45 g, Rfl: 4  Axis Diagnosis:  Major depression, recurrent, moderate.  Generalized anxiety disorder   Level of Care:  IOP  Discharge destination:  Other:  has appointment with psychiatrist and therapist  Is patient on multiple antipsychotic therapies at discharge:  No    Has Patient had three or more failed trials of antipsychotic monotherapy by history:   Negative  Patient phone:  [phone] (home)  Patient address[place]   [address] [place] [address],   Follow-up recommendations:  Activity:  continue current activity Diet:  continue current diet  Comments:  none  The patient received suicide prevention pamphlet:  Yes   Carolanne GrumblingGerald Taylor 06/29/2015, 10:20 AM

## 2015-06-30 ENCOUNTER — Other Ambulatory Visit (HOSPITAL_COMMUNITY): Payer: 59 | Admitting: Psychiatry

## 2015-06-30 DIAGNOSIS — F331 Major depressive disorder, recurrent, moderate: Secondary | ICD-10-CM | POA: Diagnosis not present

## 2015-06-30 DIAGNOSIS — F411 Generalized anxiety disorder: Secondary | ICD-10-CM
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
## 2015-06-30 NOTE — Patient Instructions (Signed)
Patient completed MH-IOP today.  Will follow up with Dr. Jannifer FranklinAkintayo on 07-04-15 @ 4:30 pm and [name], [facility] on 07-10-15 @ 1 pm.  Encouraged support groups.

## 2015-07-03 ENCOUNTER — Other Ambulatory Visit (HOSPITAL_COMMUNITY): Payer: 59

## 2015-07-03 NOTE — Progress Notes (Signed)
    Daily Group Progress Note  Program: IOP Group Time: 9:15-12:00  Participation Level: active  Behavioral Response: engaged, responsive  Time: Group Therapy  Summary: Patient had mixed emotions about discharge. She mentioned that she felt like she was going through the motions, but that she was not "feeling it." The patient mentioned her desire to "hold on to the string." Patient believed that if she let go of the string that she was giving up on her daughter. Therapist addressed that she did not have control of the string, and that only her daughter could be in control of her life. Patient acknowledged the theme of co-dependence, and that it was her job to work through that in the future. Therapist encouraged patient to read Co-dependant No More, to help with the learning process.    Shaune PollackBrown, Sarah Wagner, LPC

## 2015-07-04 ENCOUNTER — Other Ambulatory Visit (HOSPITAL_COMMUNITY): Payer: 59

## 2015-07-05 ENCOUNTER — Other Ambulatory Visit (HOSPITAL_COMMUNITY): Payer: 59

## 2015-07-06 ENCOUNTER — Other Ambulatory Visit (HOSPITAL_COMMUNITY): Payer: 59

## 2015-07-07 ENCOUNTER — Other Ambulatory Visit (HOSPITAL_COMMUNITY): Payer: 59

## 2015-07-10 ENCOUNTER — Other Ambulatory Visit (HOSPITAL_COMMUNITY): Payer: 59

## 2015-07-10 ENCOUNTER — Ambulatory Visit (INDEPENDENT_AMBULATORY_CARE_PROVIDER_SITE_OTHER): Payer: 59 | Admitting: Licensed Clinical Social Worker

## 2015-07-10 DIAGNOSIS — F331 Major depressive disorder, recurrent, moderate: Secondary | ICD-10-CM

## 2015-07-10 DIAGNOSIS — F411 Generalized anxiety disorder: Secondary | ICD-10-CM
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
## 2015-07-11 ENCOUNTER — Other Ambulatory Visit (HOSPITAL_COMMUNITY): Payer: 59

## 2015-07-12 ENCOUNTER — Other Ambulatory Visit (HOSPITAL_COMMUNITY): Payer: 59

## 2015-07-13 ENCOUNTER — Other Ambulatory Visit (HOSPITAL_COMMUNITY): Payer: 59

## 2015-07-14 ENCOUNTER — Other Ambulatory Visit (HOSPITAL_COMMUNITY): Payer: 59

## 2015-07-16 ENCOUNTER — Other Ambulatory Visit: Payer: Self-pay | Admitting: Internal Medicine

## 2015-07-17 ENCOUNTER — Other Ambulatory Visit (HOSPITAL_COMMUNITY): Payer: 59

## 2015-07-19 ENCOUNTER — Other Ambulatory Visit (HOSPITAL_COMMUNITY): Payer: 59

## 2015-07-20 ENCOUNTER — Other Ambulatory Visit (HOSPITAL_COMMUNITY): Payer: 59

## 2015-07-20 ENCOUNTER — Ambulatory Visit (INDEPENDENT_AMBULATORY_CARE_PROVIDER_SITE_OTHER): Payer: 59 | Admitting: Licensed Clinical Social Worker

## 2015-07-20 DIAGNOSIS — F331 Major depressive disorder, recurrent, moderate: Secondary | ICD-10-CM

## 2015-07-20 DIAGNOSIS — F411 Generalized anxiety disorder: Secondary | ICD-10-CM | POA: Diagnosis not present
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
## 2015-07-21 ENCOUNTER — Other Ambulatory Visit (HOSPITAL_COMMUNITY): Payer: 59

## 2015-07-24 ENCOUNTER — Other Ambulatory Visit (HOSPITAL_COMMUNITY): Payer: 59

## 2015-07-25 ENCOUNTER — Other Ambulatory Visit (HOSPITAL_COMMUNITY): Payer: 59

## 2015-07-26 ENCOUNTER — Other Ambulatory Visit (HOSPITAL_COMMUNITY): Payer: 59

## 2015-07-27 ENCOUNTER — Other Ambulatory Visit (HOSPITAL_COMMUNITY): Payer: 59

## 2015-07-27 ENCOUNTER — Ambulatory Visit (INDEPENDENT_AMBULATORY_CARE_PROVIDER_SITE_OTHER): Payer: 59 | Admitting: Licensed Clinical Social Worker

## 2015-07-27 DIAGNOSIS — F411 Generalized anxiety disorder: Secondary | ICD-10-CM

## 2015-07-27 DIAGNOSIS — F331 Major depressive disorder, recurrent, moderate: Secondary | ICD-10-CM | POA: Diagnosis not present

## 2015-07-27 NOTE — Progress Notes (Signed)
   THERAPIST PROGRESS NOTE  Session Time: 11:10-11:55 am  Participation Level: Active  Behavioral Response: CasualAlert  Type of Therapy: Individual Therapy  Treatment Goals addressed: Coping  Interventions: CBT and Supportive  Summary: Sarah Wagner is a 55 y.o. female who presents with anxieity and depression.   Suicidal/Homicidal: Nowithout intent/plan  Therapist Response: Pt presented upbeat today. She shared she felt good about a decision she made about her job. 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 It sounds like a positive change for pt. She talked about her daughter and how she is trying to let go and detach from her daughter. DIscussed with pt What is the worse that came happed with your daughter and what do you have control over. Pt was able to verbalize she didn't know which would be worse, her daughter dead or her daughter living on the streets. Processed this with pt. Discussed with pt what her "new normal" looks like. Pt has been journaling and meditation. Pt read from her journal which was descriptive of her feelings. Pt will continue to use her coping skills in dealing with her anxity and depression.  Plan: Return again in 1 weeks.  Diagnosis: Axis I: Major Depressive Disorder, Generalized Anxiety Disorder    Axis II:     MACKENZIE,LISBETH S, Licensed Cli 07/27/2015

## 2015-07-28 ENCOUNTER — Other Ambulatory Visit (HOSPITAL_COMMUNITY): Payer: 59

## 2015-07-31 ENCOUNTER — Other Ambulatory Visit (HOSPITAL_COMMUNITY): Payer: 59

## 2015-08-01 ENCOUNTER — Other Ambulatory Visit (HOSPITAL_COMMUNITY): Payer: 59

## 2015-08-02 ENCOUNTER — Other Ambulatory Visit (HOSPITAL_COMMUNITY): Payer: 59

## 2015-08-03 ENCOUNTER — Ambulatory Visit (INDEPENDENT_AMBULATORY_CARE_PROVIDER_SITE_OTHER): Payer: 59 | Admitting: Licensed Clinical Social Worker

## 2015-08-03 DIAGNOSIS — F331 Major depressive disorder, recurrent, moderate: Secondary | ICD-10-CM | POA: Diagnosis not present

## 2015-08-03 DIAGNOSIS — F411 Generalized anxiety disorder: Secondary | ICD-10-CM | POA: Diagnosis not present

## 2015-08-03 NOTE — Progress Notes (Signed)
   THERAPIST PROGRESS NOTE  Session Time: 1:10-2:00 pm  Participation Level: Active  Behavioral Response: CasualAlertAnxious and Depressed  Type of Therapy: Individual Therapy  Treatment Goals addressed: Anxiety and Coping and depression  Interventions: CBT, Supportive and Reframing  Summary: Sarah Wagner is a 55 y.o. female who presents with anxiety and depression.   Suicidal/Homicidal: Nowithout intent/plan  Therapist Response: Pt presented anxious. Her moods are governed by how her daughter is doing. Discussed with pt her own personal self-care. Pt has used some previous suggestions on self-care: turning her cell phone off at night, exercising (swimming), reaching out to friends, spending quality time with her husband. Pt tearfully shared she had some disturbing (manipulative) calls from daughter. Discussed with pt how to use boundaries with her daughter. Pt is going on vacation with her son, son's girlfriend and her husband next week. Discussion with pt about coping skills for her anxiety and or panic attacks while on vacation. Pt brought in her journal to read. She is very expressive and creative. Pt reports it helps to put all of her feelings in  Words and pictures. Re-discussed guided imagery for pt to use as well as breathing exercises. Pt was open to suggestions discussed.  Plan: Return again in 2 weeks after vacation.  Diagnosis: Axis I: Generalized Anxiety Disorder, Major Depressive Disorder    Axis II: No DX    Shevawn Langenberg S, Licensed Cli 08/03/2015

## 2015-08-15 ENCOUNTER — Ambulatory Visit (INDEPENDENT_AMBULATORY_CARE_PROVIDER_SITE_OTHER): Payer: 59 | Admitting: Licensed Clinical Social Worker

## 2015-08-15 DIAGNOSIS — F331 Major depressive disorder, recurrent, moderate: Secondary | ICD-10-CM | POA: Diagnosis not present

## 2015-08-15 DIAGNOSIS — F411 Generalized anxiety disorder: Secondary | ICD-10-CM | POA: Diagnosis not present

## 2015-08-16 ENCOUNTER — Encounter (HOSPITAL_COMMUNITY): Payer: Self-pay | Admitting: Licensed Clinical Social Worker

## 2015-08-16 NOTE — Progress Notes (Signed)
   THERAPIST PROGRESS NOTE  Session Time: 1:10-2:00pm  Participation Level: Active  Behavioral Response: CasualAlert  Type of Therapy: Individual Therapy  Treatment Goals addressed: Anxiety and Coping  Interventions: CBT/Supportive/Reframing  Summary: Sarah Wagner is a 55 y.o. female who presents with anxiety and depression. Pt reports that she went to the beach with her family and tried to have a good time but still had racing thoughts about her daughter. 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 This is the job she has always wanted. Asked pt what she learned from getting the job of her dreams. Pt expressed gratitude. Pt did go to a [employer] meeting but it only dredges up guilt again..   Suicidal/Homicidal: Nowithout intent/plan  Therapist Response: Assessed patients current functioning and reviewed progress. Assisted pt in processing issues with her daughter, co-dependency, boundaries and letting go.  Plan: Return again in 1 weeks.  Diagnosis: Axis I: Depression, major recurrent  Moderate, Generalized Anxiety Disorder    Axis II: No DX    MACKENZIE,LISBETH S, LCAS-A 08/16/2015 1:10-2:00

## 2015-08-23 ENCOUNTER — Ambulatory Visit (INDEPENDENT_AMBULATORY_CARE_PROVIDER_SITE_OTHER): Payer: 59 | Admitting: Licensed Clinical Social Worker

## 2015-08-23 DIAGNOSIS — F331 Major depressive disorder, recurrent, moderate: Secondary | ICD-10-CM

## 2015-08-23 DIAGNOSIS — F411 Generalized anxiety disorder: Secondary | ICD-10-CM

## 2015-08-24 ENCOUNTER — Encounter (HOSPITAL_COMMUNITY): Payer: Self-pay | Admitting: Licensed Clinical Social Worker

## 2015-08-24 NOTE — Progress Notes (Signed)
   THERAPIST PROGRESS NOTE  Session Time: 3:15-4:00pm  Participation Level: Active  Behavioral Response: CasualAlertAnxious  Type of Therapy: Individual Therapy  Treatment Goals addressed: Coping  Interventions: CBT and Supportive  Summary: Sarah PaganRebecca Wagner is a 55 y.o. female who presents with depression and anxitiy. Pt reports she is more anxious today due to several incidents in her life. Pt had to put her dog of many years down. She shared her feelings of having to do this. Pt's daughter is coming back to [place] and wants to come by to see her pets and parents. Pt is very anxious about this because pt's anxiety and depression ramps up when daughter comes back with all her problems surrounding her addiction. 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 This is also making her more anxious. Asked pt about her daughter.Marland Kitchen.Marland Kitchen.What if this is going to be your daughter's life forever? Pt was tearful when replying how her daugher's life affects her own life. Encouraged pt to use her boundaries and coping skills when her daughter visits.  Suicidal/Homicidal: Nowithout intent/plan  Therapist Response:Assessed patients current functioning and reviewed her progress. Assisited pt in processing issues with her daughter and her management of her stressors.  Plan: Return again in 2 weeks.  Diagnosis: Axis I: F33.1, F41.1    Axis II:     Ibrahim Mcpheeters S, LCAS-A 08/24/2015

## 2015-09-07 ENCOUNTER — Encounter (HOSPITAL_COMMUNITY): Payer: Self-pay | Admitting: Licensed Clinical Social Worker

## 2015-09-07 ENCOUNTER — Ambulatory Visit (INDEPENDENT_AMBULATORY_CARE_PROVIDER_SITE_OTHER): Payer: 59 | Admitting: Licensed Clinical Social Worker

## 2015-09-07 DIAGNOSIS — F411 Generalized anxiety disorder: Secondary | ICD-10-CM | POA: Diagnosis not present

## 2015-09-07 DIAGNOSIS — F331 Major depressive disorder, recurrent, moderate: Secondary | ICD-10-CM
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
## 2015-09-11 ENCOUNTER — Ambulatory Visit (HOSPITAL_COMMUNITY): Payer: 59 | Admitting: Licensed Clinical Social Worker

## 2015-09-11 DIAGNOSIS — F331 Major depressive disorder, recurrent, moderate: Secondary | ICD-10-CM

## 2015-09-11 DIAGNOSIS — F411 Generalized anxiety disorder: Secondary | ICD-10-CM

## 2015-09-12 ENCOUNTER — Encounter (HOSPITAL_COMMUNITY): Payer: Self-pay | Admitting: Licensed Clinical Social Worker

## 2015-09-12 NOTE — Progress Notes (Signed)
Daily Group Progress Note  Program: Outpatient  Group Time: 5:15-6:30  Participation Level: Active  Behavioral Response: Appropriate  Type of Therapy:  Psychoeducation/Group Therapy  Summary of Progress:   Pt participated in a discussion: "being alone and not being lonely." During the discussion pt pointed out that she needs to work on her self-esteem so that she enjoys her own company. It was suggested to pt that she must first like herself before she will be able to bring someone else into her life. Pt was open during the discussion and suggestions.     Lisbeth S. Mackenzie, LCAS-A 

## 2015-09-13 ENCOUNTER — Ambulatory Visit (INDEPENDENT_AMBULATORY_CARE_PROVIDER_SITE_OTHER): Payer: 59 | Admitting: Licensed Clinical Social Worker

## 2015-09-13 DIAGNOSIS — F411 Generalized anxiety disorder: Secondary | ICD-10-CM | POA: Diagnosis not present

## 2015-09-13 DIAGNOSIS — F331 Major depressive disorder, recurrent, moderate: Secondary | ICD-10-CM | POA: Diagnosis not present

## 2015-09-14 ENCOUNTER — Encounter (HOSPITAL_COMMUNITY): Payer: Self-pay | Admitting: Licensed Clinical Social Worker

## 2015-09-14 NOTE — Progress Notes (Signed)
  Daily Group Progress Note  Program: Outpatient  Group Time: 5:15-6:30  Participation Level: Active  Behavioral Response: Appropriate  Type of Therapy:  Psychoeducation/Group Therapy  Summary of Progress:   Pt participated in a discussion: "Being strong, when you don't feel like being strong." Pt appeared open in the discussion about expectations from others. It was suggested to pt to use her self-awareness and self-care to be herself. Pt was receptive to the intervention.   Lisbeth S. Mackenzie, LCAS-A   

## 2015-09-18 ENCOUNTER — Ambulatory Visit (HOSPITAL_COMMUNITY): Payer: Self-pay | Admitting: Licensed Clinical Social Worker

## 2015-09-20 ENCOUNTER — Ambulatory Visit (HOSPITAL_COMMUNITY): Payer: Self-pay | Admitting: Licensed Clinical Social Worker

## 2015-09-20 ENCOUNTER — Other Ambulatory Visit: Payer: Self-pay | Admitting: Internal Medicine

## 2015-09-25 ENCOUNTER — Ambulatory Visit (INDEPENDENT_AMBULATORY_CARE_PROVIDER_SITE_OTHER): Payer: 59 | Admitting: Licensed Clinical Social Worker

## 2015-09-25 DIAGNOSIS — F331 Major depressive disorder, recurrent, moderate: Secondary | ICD-10-CM

## 2015-09-25 DIAGNOSIS — F411 Generalized anxiety disorder: Secondary | ICD-10-CM | POA: Diagnosis not present

## 2015-09-27 ENCOUNTER — Telehealth: Payer: Self-pay | Admitting: Internal Medicine

## 2015-09-27 ENCOUNTER — Encounter (HOSPITAL_COMMUNITY): Payer: Self-pay | Admitting: Licensed Clinical Social Worker

## 2015-09-27 ENCOUNTER — Ambulatory Visit (INDEPENDENT_AMBULATORY_CARE_PROVIDER_SITE_OTHER): Payer: 59 | Admitting: Licensed Clinical Social Worker

## 2015-09-27 DIAGNOSIS — F411 Generalized anxiety disorder: Secondary | ICD-10-CM

## 2015-09-27 NOTE — Telephone Encounter (Signed)
Please refer to a genetic counselor

## 2015-09-27 NOTE — Telephone Encounter (Signed)
Dr.K, please see message and advise if okay to do order?

## 2015-09-27 NOTE — Telephone Encounter (Signed)
Pt would like to have gene sight test. Please fax order [phone] or call [phone]

## 2015-09-27 NOTE — Progress Notes (Signed)
Daily Group Progress Note  Program: Outpatient   Group Time: 5:15-6:6:45pm  Participation Level: Active  Behavioral Response: Appropriate  Type of Therapy:  Psychoeducation/Group Therapy  Summary of Progress:   Pt participated in a discussion on vulnerability and trust in relationships. Pt discussed Brene Brown's Ted Talks on vulnerability. Pt was encouraged to use the information from Brene Brown's Ted Talks "Vulnerability" to begin forming trust in her ownrelationships. Pt was receptive to intervention and suggestions.     Keyonte Cookston S. Caroljean Monsivais, LCAS-A    

## 2015-09-28 ENCOUNTER — Encounter (HOSPITAL_COMMUNITY): Payer: Self-pay | Admitting: Licensed Clinical Social Worker

## 2015-09-28 NOTE — Progress Notes (Signed)
Daily Group Progress Note  Program: Outpatient   Group Time: 5:15-7:00pm  Participation Level: Active  Behavioral Response: Appropriate  Type of Therapy:  Psychoeducation/Group Therapy  Summary of Progress:   Pt participated in a discussion on anxiety triggers. Pt identified her personal anxiety triggers. Pt was encouraged to use her coping skills to prevent the anxiety from spiraling out of control.    Lisbeth S. Mackenzie, LCAS-A 

## 2015-09-28 NOTE — Telephone Encounter (Signed)
Spoke to pt, told her Dr.K said he would order DentistGenetic Counselor for you to discuss genetic testing. Pt said no, that is okay [facility] if [facility] was helping her. Told her okay.

## 2015-10-02 ENCOUNTER — Ambulatory Visit (INDEPENDENT_AMBULATORY_CARE_PROVIDER_SITE_OTHER): Payer: 59 | Admitting: Licensed Clinical Social Worker

## 2015-10-02 DIAGNOSIS — F411 Generalized anxiety disorder: Secondary | ICD-10-CM

## 2015-10-02 DIAGNOSIS — F331 Major depressive disorder, recurrent, moderate: Secondary | ICD-10-CM | POA: Diagnosis not present

## 2015-10-03 ENCOUNTER — Encounter (HOSPITAL_COMMUNITY): Payer: Self-pay | Admitting: Licensed Clinical Social Worker

## 2015-10-03 NOTE — Progress Notes (Signed)
Daily Group Progress Note  Program: Outpatient Group   Group Time: 5:15-6:55pm  Participation Level: Active  Behavioral Response: Appropriate  Type of Therapy:  Psychoeducation/Group Therapy  Summary of Progress:   Pt participated in a discussion on relationships in recovery; mental illness can destroy relationships or enhance them. Pt was encouraged to put herself and her recovery first before beginning a new relationship. Pt was engaged and receptive to the group interventions.   Sarah Wagner, LCAS-A 

## 2015-10-04 ENCOUNTER — Telehealth (HOSPITAL_COMMUNITY): Payer: Self-pay | Admitting: Licensed Clinical Social Worker

## 2015-10-04 ENCOUNTER — Ambulatory Visit (HOSPITAL_COMMUNITY): Payer: Self-pay | Admitting: Licensed Clinical Social Worker

## 2015-10-09 ENCOUNTER — Ambulatory Visit (INDEPENDENT_AMBULATORY_CARE_PROVIDER_SITE_OTHER): Payer: 59 | Admitting: Licensed Clinical Social Worker

## 2015-10-09 DIAGNOSIS — F331 Major depressive disorder, recurrent, moderate: Secondary | ICD-10-CM | POA: Diagnosis not present

## 2015-10-09 DIAGNOSIS — F411 Generalized anxiety disorder: Secondary | ICD-10-CM

## 2015-10-11 ENCOUNTER — Ambulatory Visit (INDEPENDENT_AMBULATORY_CARE_PROVIDER_SITE_OTHER): Payer: 59 | Admitting: Licensed Clinical Social Worker

## 2015-10-11 ENCOUNTER — Encounter (HOSPITAL_COMMUNITY): Payer: Self-pay | Admitting: Licensed Clinical Social Worker

## 2015-10-11 DIAGNOSIS — F331 Major depressive disorder, recurrent, moderate: Secondary | ICD-10-CM

## 2015-10-11 DIAGNOSIS — F411 Generalized anxiety disorder: Secondary | ICD-10-CM

## 2015-10-11 NOTE — Progress Notes (Signed)
Daily Group Progress Note Program:  Outpatient  Group Time: 5:15-6:15 pm  Participation Level: Active  Behavioral Response: Appropriate  Type of Therapy:  Psychoeducation  Summary of Progress: Pt. Presents with primarily flat affect, responsive to group questions and process. Pt. Reported she is sad this time of year. Her father's death happened several years ago around this time of year. Another pt gave feedback of losing her father and feelings surrounding that death. Pt shared she's edgy and glad she came to group to discuss it. Pt was encouraged to using her skills to deal with her depressive symptoms. Pt accepted feedback from group members. And shared she felt much better by the end of group.      Vernona RiegerLisbeth S. Mackenzie, LCAS-A

## 2015-10-16 ENCOUNTER — Encounter (HOSPITAL_COMMUNITY): Payer: Self-pay | Admitting: Licensed Clinical Social Worker

## 2015-10-16 ENCOUNTER — Ambulatory Visit (HOSPITAL_COMMUNITY): Payer: 59 | Admitting: Licensed Clinical Social Worker

## 2015-10-16 DIAGNOSIS — F411 Generalized anxiety disorder: Secondary | ICD-10-CM

## 2015-10-16 DIAGNOSIS — F331 Major depressive disorder, recurrent, moderate: Secondary | ICD-10-CM

## 2015-10-16 NOTE — Progress Notes (Signed)
  Daily Group Progress Note Program:  Outpatient  Group Time: 5:00-6:00 pm  Participation Level: Active  Behavioral Response: Appropriate  Type of Therapy:  Psychoeducation/Therapy  Summary of Progress: Pt participated in a discussion on co-dependency where she was able to identify how she is in a codependent relationship with her son. Because of the co-dependent relationship she has relied on her son for emotional support. Pt was open and honest in the discussion. Pt was given a handout on co-dependency. Pt was encouraged to use "Keep the focus on ourselves,"  in terms of self-empowerment and permission to heal from a dysfunctional relationship.   Vernona RiegerLisbeth S. Michelena Culmer, LCAS-A

## 2015-10-17 ENCOUNTER — Encounter (HOSPITAL_COMMUNITY): Payer: Self-pay | Admitting: Licensed Clinical Social Worker
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
## 2015-10-18 ENCOUNTER — Ambulatory Visit (INDEPENDENT_AMBULATORY_CARE_PROVIDER_SITE_OTHER): Payer: 59 | Admitting: Licensed Clinical Social Worker

## 2015-10-18 DIAGNOSIS — F411 Generalized anxiety disorder: Secondary | ICD-10-CM

## 2015-10-18 DIAGNOSIS — F331 Major depressive disorder, recurrent, moderate: Secondary | ICD-10-CM | POA: Diagnosis not present

## 2015-10-20 NOTE — Progress Notes (Signed)
Daily Group Progress Note Program:  Outpatient  Group Time: 5:15-6:45 pm  Participation Level: Active  Behavioral Response: Appropriate  Type of Therapy:  Psychoeducation/Therapy  Summary of Progress: Patient reported on time and states she is doing "surprisingly well." Patient shared ongoing issues with the relationship with her daughter and new developments. Patient demonstrated acceptance, positive reframing, and focusing on what she can control. Patient participated and provided feedback to the group. Patient denies SI/HI/self harm thoughts.    Sarah GuilesJenny [name], MSW, LCSW, LCAS

## 2015-10-23 ENCOUNTER — Ambulatory Visit (HOSPITAL_COMMUNITY): Payer: 59 | Admitting: Licensed Clinical Social Worker

## 2015-10-23 DIAGNOSIS — F331 Major depressive disorder, recurrent, moderate: Secondary | ICD-10-CM

## 2015-10-23 DIAGNOSIS — F411 Generalized anxiety disorder: Secondary | ICD-10-CM

## 2015-10-24 ENCOUNTER — Encounter (HOSPITAL_COMMUNITY): Payer: Self-pay | Admitting: Licensed Clinical Social Worker

## 2015-10-24 NOTE — Progress Notes (Signed)
Daily Group Progress Note Program:  Outpatient  Group Time: 5:00-6:00 pm  Participation Level: Active  Behavioral Response: Appropriate  Type of Therapy:  Psychoeducation/Therapy  Summary of Progress: Pt participated in a discussion on discovering and reconstructing an enduring sense of self as an important aspect for improvement of symptoms of depression and anxiety. Pt was encouraged to accomplish her self-determined goals that will enhance her sense of well-being. Pt was receptive to suggestions and encouragement given during the intervention.   Lisbeth S. Mackenzie, LCAS 

## 2015-10-25 ENCOUNTER — Ambulatory Visit (INDEPENDENT_AMBULATORY_CARE_PROVIDER_SITE_OTHER): Payer: 59 | Admitting: Licensed Clinical Social Worker

## 2015-10-25 DIAGNOSIS — F411 Generalized anxiety disorder: Secondary | ICD-10-CM

## 2015-10-25 DIAGNOSIS — F331 Major depressive disorder, recurrent, moderate: Secondary | ICD-10-CM | POA: Diagnosis not present

## 2015-10-26 ENCOUNTER — Encounter (HOSPITAL_COMMUNITY): Payer: Self-pay | Admitting: Licensed Clinical Social Worker

## 2015-10-26 NOTE — Progress Notes (Signed)
Daily Group Progress Note Program:  Outpatient   Group Time: 5:00-6:00 pm  Participation Level: Active  Behavioral Response: Appropriate  Type of Therapy:  Psychoeducation/Therapy  Summary of Progress: Pt participated in a discussion on incorporating new feelings in a life of mental wellness. Feeling "good" is a new and different way for patients that have been immersed in mental illness, and may feel uncomfortable at first. The depressed brain sees feeling good as different and "not right", so the tendency is to go back to the thoughts, feelings and behaviors of depression. Pt was encouraged to move towards a process of change through where she can improve her health and wellness, live a self-directed life, with the ability to strive to reach her full potential. Pt participated in the intervention and suggestions/feedback.  Lisbeth S. Mackenzie, LCAS 

## 2015-10-30 ENCOUNTER — Ambulatory Visit (INDEPENDENT_AMBULATORY_CARE_PROVIDER_SITE_OTHER): Payer: 59 | Admitting: Licensed Clinical Social Worker

## 2015-10-30 DIAGNOSIS — F411 Generalized anxiety disorder: Secondary | ICD-10-CM | POA: Diagnosis not present

## 2015-10-30 DIAGNOSIS — F331 Major depressive disorder, recurrent, moderate: Secondary | ICD-10-CM | POA: Diagnosis not present

## 2015-10-31 ENCOUNTER — Encounter (HOSPITAL_COMMUNITY): Payer: Self-pay | Admitting: Licensed Clinical Social Worker

## 2015-10-31 NOTE — Progress Notes (Signed)
Daily Group Progress Note Program:  Outpatient  Group Time: 5:00-6:00 pm  Participation Level: Active  Behavioral Response: Appropriate  Type of Therapy:  Psychoeducation/Therapy  Summary of Progress:  Pt participated in a discussion on childhood experiences affecting adult mental wellness. Survivors of negative childhood experiences often tend to develop unhealthy coping skills and difficulty regulating emotions which effect adult mental wellness, especially her anxiety and depression. Pt was encouraged to continue using her positive coping skills for her depression and anxiety, practice self-care and develop a positive support system. Pt was active during the intervention and was receptive to suggestions and feedback.  Sarah Wagner, Sarah Wagner

## 2015-11-01 ENCOUNTER — Ambulatory Visit (HOSPITAL_COMMUNITY): Payer: 59 | Admitting: Licensed Clinical Social Worker

## 2015-11-06 ENCOUNTER — Ambulatory Visit (INDEPENDENT_AMBULATORY_CARE_PROVIDER_SITE_OTHER): Payer: 59 | Admitting: Licensed Clinical Social Worker

## 2015-11-06 DIAGNOSIS — F331 Major depressive disorder, recurrent, moderate: Secondary | ICD-10-CM

## 2015-11-06 DIAGNOSIS — F411 Generalized anxiety disorder: Secondary | ICD-10-CM

## 2015-11-07 ENCOUNTER — Encounter (HOSPITAL_COMMUNITY): Payer: Self-pay | Admitting: Licensed Clinical Social Worker

## 2015-11-07 NOTE — Progress Notes (Signed)
Daily Group Progress Note Program:  Outpatient  Group Time: 5:00-6:00 pm  Participation Level: Active  Behavioral Response: Appropriate  Type of Therapy:  Psychoeducation/Therapy  Summary of Progress:  Pt participated in a discussion about empowerment in mental wellness by starting and maintaining conversations of hope and desired outcomes and replacing old disempowering conversations with new ones of strengths and wellness. Pt was encouraged to practice conversations based on strengths and wellness. Pt was receptive to suggestions and intervention.  Lisbeth S. Mackenzie, LCAS 

## 2015-11-08 ENCOUNTER — Telehealth (HOSPITAL_COMMUNITY): Payer: Self-pay | Admitting: Licensed Clinical Social Worker

## 2015-11-08 ENCOUNTER — Ambulatory Visit (HOSPITAL_COMMUNITY): Payer: Self-pay | Admitting: Licensed Clinical Social Worker

## 2015-11-13 ENCOUNTER — Ambulatory Visit (INDEPENDENT_AMBULATORY_CARE_PROVIDER_SITE_OTHER): Payer: 59 | Admitting: Licensed Clinical Social Worker

## 2015-11-13 DIAGNOSIS — F331 Major depressive disorder, recurrent, moderate: Secondary | ICD-10-CM | POA: Diagnosis not present

## 2015-11-13 DIAGNOSIS — F411 Generalized anxiety disorder: Secondary | ICD-10-CM | POA: Diagnosis not present

## 2015-11-14 ENCOUNTER — Encounter (HOSPITAL_COMMUNITY): Payer: Self-pay | Admitting: Licensed Clinical Social Worker

## 2015-11-14 NOTE — Progress Notes (Signed)
Daily Group Progress Note Program:  Outpatient  Group Time: 5:30-6:30 pm  Participation Level: Active  Behavioral Response: Appropriate  Type of Therapy:  Psychoeducation/Therapy  Summary of Progress:  Pt participated in a discussion on the impact mental illness has on levels of self-confidence, while pursuing mental wellness and recovery. Pt was encouraged to find stability or work towards it, which will increase levels of confidence. In learning through self-reflection, pt will discover what she works hardest to achieve will feel the most satisfying and make recovery more easily attainable. Pt was receptive to suggestions and intervention.  Lisbeth S. Mackenzie, LCAS  

## 2015-11-15 ENCOUNTER — Ambulatory Visit (HOSPITAL_COMMUNITY): Payer: 59 | Admitting: Licensed Clinical Social Worker

## 2015-11-15 DIAGNOSIS — F331 Major depressive disorder, recurrent, moderate: Secondary | ICD-10-CM

## 2015-11-15 DIAGNOSIS — F411 Generalized anxiety disorder: Secondary | ICD-10-CM

## 2015-11-16 ENCOUNTER — Encounter (HOSPITAL_COMMUNITY): Payer: Self-pay | Admitting: Licensed Clinical Social Worker

## 2015-11-16 NOTE — Progress Notes (Signed)
Daily Group Progress Note Program:  Outpatient  Group Time: 5:30-6:30 pm  Participation Level: Active  Behavioral Response: Appropriate  Type of Therapy:  Psychoeducation/Therapy  Summary of Progress:  Pt participated in a discussion on self-awareness = mental wellness, channeling thoughts, emotions and actions. Failure to be aware leads to automatic thoughts, feelings and actions that may be counterproductive to pt's goals.  Pt was encouraged to assess what is right in her life and what she needs to modify to move towards self-awareness through conscious awareness. Pt was receptive to intervention and suggestions.  Lisbeth S. Mackenzie, LCAS 

## 2015-11-20 ENCOUNTER — Ambulatory Visit (INDEPENDENT_AMBULATORY_CARE_PROVIDER_SITE_OTHER): Payer: 59 | Admitting: Licensed Clinical Social Worker

## 2015-11-20 DIAGNOSIS — F411 Generalized anxiety disorder: Secondary | ICD-10-CM

## 2015-11-20 DIAGNOSIS — F331 Major depressive disorder, recurrent, moderate: Secondary | ICD-10-CM | POA: Diagnosis not present

## 2015-11-21 ENCOUNTER — Encounter (HOSPITAL_COMMUNITY): Payer: Self-pay | Admitting: Licensed Clinical Social Worker

## 2015-11-21 NOTE — Progress Notes (Signed)
Daily Group Progress Note Program:  Outpatient  Group Time: 5:30-6:30 pm  Participation Level: Active  Behavioral Response: Appropriate  Type of Therapy: Solution-Focused Therapy  Summary of Progress:  Pt participated in a discussion on the miracle question: What if you woke up this morning and your problem had magically disappeared, what would your life be like now? This question was asked to see what the pt wants her future to be, as well as how the problem manifests in her life now. Pt was able to measure future progress in comparison to what symptoms she is experiencing now. Pt was receptive to intervention and suggestions.   Amberle Lyter S. Andy Moye, LCAS 

## 2015-11-22 ENCOUNTER — Ambulatory Visit (INDEPENDENT_AMBULATORY_CARE_PROVIDER_SITE_OTHER): Payer: 59 | Admitting: Licensed Clinical Social Worker

## 2015-11-22 DIAGNOSIS — F411 Generalized anxiety disorder: Secondary | ICD-10-CM

## 2015-11-22 DIAGNOSIS — F331 Major depressive disorder, recurrent, moderate: Secondary | ICD-10-CM

## 2015-11-23 ENCOUNTER — Encounter (HOSPITAL_COMMUNITY): Payer: Self-pay | Admitting: Licensed Clinical Social Worker

## 2015-11-23 NOTE — Progress Notes (Signed)
Daily Group Progress Note Program:  Outpatient  Group Time: 5:30-6:30 pm  Participation Level: Active  Behavioral Response: Appropriate  Type of Therapy: Psychoeducation/Group process  Summary of Progress: Pt participated in a discussion on stress coping skills in mental health recovery. Pt shared her stress triggers are due to her daughter's continued drug use. Normally, the pt's stress will evolve into anxiety and then depression. Pt was encouraged to use her wellness tools of relaxation and stress reduction techniques to increase her overall wellness. Pt was receptive to the intervention and suggestions.   Vernona RiegerLisbeth S. Tonna Palazzi, LCAS

## 2015-11-27 ENCOUNTER — Ambulatory Visit (INDEPENDENT_AMBULATORY_CARE_PROVIDER_SITE_OTHER): Payer: 59 | Admitting: Licensed Clinical Social Worker

## 2015-11-27 DIAGNOSIS — F331 Major depressive disorder, recurrent, moderate: Secondary | ICD-10-CM | POA: Diagnosis not present

## 2015-11-27 DIAGNOSIS — F411 Generalized anxiety disorder: Secondary | ICD-10-CM | POA: Diagnosis not present

## 2015-11-28 ENCOUNTER — Encounter (HOSPITAL_COMMUNITY): Payer: Self-pay | Admitting: Licensed Clinical Social Worker

## 2015-11-28 NOTE — Progress Notes (Signed)
Daily Group Progress Note Program:  Outpatient  Group Time: 5:30-6:30 pm  Participation Level: Active  Behavioral Response: Appropriate  Type of Therapy:  Psychoeducation/Therapy  Summary of Progress:  Pt participated in a discussion about coping skills for anxiety and depression. Pt shared with the group the coping skills that work for her: talking about her issues and not bottling up her feelings, coming to therapy group, which gave suggestions to other group members. Pt was encouraged to continue using her coping skills. Pt was receptive to suggestions and intervention.  Vernona RiegerLisbeth S. Isabel Ardila, LCAS

## 2015-11-29 ENCOUNTER — Ambulatory Visit (HOSPITAL_COMMUNITY): Payer: Self-pay | Admitting: Licensed Clinical Social Worker

## 2015-12-04 ENCOUNTER — Ambulatory Visit (INDEPENDENT_AMBULATORY_CARE_PROVIDER_SITE_OTHER): Payer: 59 | Admitting: Licensed Clinical Social Worker

## 2015-12-04 DIAGNOSIS — F331 Major depressive disorder, recurrent, moderate: Secondary | ICD-10-CM | POA: Diagnosis not present

## 2015-12-04 DIAGNOSIS — F411 Generalized anxiety disorder: Secondary | ICD-10-CM | POA: Diagnosis not present

## 2015-12-06 ENCOUNTER — Ambulatory Visit (HOSPITAL_COMMUNITY): Payer: Self-pay | Admitting: Licensed Clinical Social Worker

## 2015-12-06 ENCOUNTER — Encounter (HOSPITAL_COMMUNITY): Payer: Self-pay | Admitting: Licensed Clinical Social Worker

## 2015-12-06 NOTE — Progress Notes (Signed)
Daily Group Progress Note Program:  Outpatient  Group Time: 5:30-6:30 pm  Participation Level: Active  Behavioral Response: Appropriate  Type of Therapy:  Psychoeducation/Therapy  Summary of Progress: Pt participated in a discussion on relationships in mental health recovery. Pt completed an activity on "The Relationship Circle": intimate, close, and acquaintances. A discussion ensued on the placement in the circle of people allowed in her life. The discussion continued where patient showed how some people move around in different circles so that she may protect herself. Pt was encouraged to continue to use boundaries with people in her life for healthy self-care.  Alexandrea Westergard S. Arynn Armand, LCAS  

## 2015-12-11 ENCOUNTER — Ambulatory Visit (HOSPITAL_COMMUNITY): Payer: 59 | Admitting: Licensed Clinical Social Worker

## 2015-12-11 DIAGNOSIS — F411 Generalized anxiety disorder: Secondary | ICD-10-CM

## 2015-12-11 DIAGNOSIS — F331 Major depressive disorder, recurrent, moderate: Secondary | ICD-10-CM

## 2015-12-12 ENCOUNTER — Encounter (HOSPITAL_COMMUNITY): Payer: Self-pay | Admitting: Licensed Clinical Social Worker

## 2015-12-12 NOTE — Progress Notes (Signed)
Daily Group Progress Note Program:  Outpatient  Group Time: 5:30-6:30 pm  Participation Level: Active  Behavioral Response: Appropriate  Type of Therapy:  Psychoeducation/Therapy  Summary of Progress:  Patient participated in a group discussion on "Surviving family during the holidays while working on my own mental wellness." The holidays can be enjoyable but can be filled with increased tension and encounters with people who can be triggering. Pt pointed out that her normal reactionary behavior often caused more problems during holidays, but was able to remove herself from the festivities to process and then was able to return in a thoughtful and responsible way. Pt was encouraged to continue to use these behaviors in future family conflicts that may be triggering. Pt was engaged during the intervention.  Lisbeth S. Mackenzie, LCAS   

## 2015-12-13 ENCOUNTER — Ambulatory Visit (HOSPITAL_COMMUNITY): Payer: 59 | Admitting: Licensed Clinical Social Worker

## 2015-12-13 DIAGNOSIS — F411 Generalized anxiety disorder: Secondary | ICD-10-CM

## 2015-12-13 DIAGNOSIS — F331 Major depressive disorder, recurrent, moderate: Secondary | ICD-10-CM

## 2015-12-14 ENCOUNTER — Encounter (HOSPITAL_COMMUNITY): Payer: Self-pay | Admitting: Licensed Clinical Social Worker

## 2015-12-14 NOTE — Progress Notes (Signed)
Daily Group Progress Note Program:  Outpatient  Group Time: 5:30-6:30 pm  Participation Level: Active  Behavioral Response: Appropriate  Type of Therapy:  Psychoeducation/Therapy  Summary of Progress: Pt participated in an art activity led by one of the group members who is an art therapist. Using art to help in the recovery process of mental illness can be effective in exploring and expressing feelings and improving overall well-being. One art activity concentrated on expressing emotions; the other art activity depicted: "How do I see myself?' Pt was encouraged to continue expressing her emotions in a healthy manner and continue to create positive images of herself through self-esteem coping skills.  Lisbeth S. Mackenzie, LCAS  

## 2015-12-18 ENCOUNTER — Ambulatory Visit (INDEPENDENT_AMBULATORY_CARE_PROVIDER_SITE_OTHER): Payer: 59 | Admitting: Licensed Clinical Social Worker

## 2015-12-18 DIAGNOSIS — F411 Generalized anxiety disorder: Secondary | ICD-10-CM

## 2015-12-18 DIAGNOSIS — F331 Major depressive disorder, recurrent, moderate: Secondary | ICD-10-CM

## 2015-12-19 ENCOUNTER — Encounter (HOSPITAL_COMMUNITY): Payer: Self-pay | Admitting: Licensed Clinical Social Worker

## 2015-12-19 NOTE — Progress Notes (Signed)
Daily Group Progress Note Program:  Outpatient  Group Time: 5:30-6:30 pm Participation Level: Active Behavioral Response: Appropriate Type of Therapy:  Psychoeducation/Therapy Summary of Progress: Pt participated in a discussion "Sitting with uncomfortable feelings," which means sitting in the discomfort and waiting before acting. Pt shared she feels she always needs to act based on her emotions, often making damaging decisions. Pt was encouraged to continue to develop her emotional intelligence, learn to sit with her negative feelings and create situations for positive feelings. Pt was receptive to the suggestion during the intervention.  Dameion Briles S. Keeara Frees, LCAS 12/18/15 

## 2015-12-20 ENCOUNTER — Ambulatory Visit (INDEPENDENT_AMBULATORY_CARE_PROVIDER_SITE_OTHER): Payer: 59 | Admitting: Licensed Clinical Social Worker

## 2015-12-20 DIAGNOSIS — F411 Generalized anxiety disorder: Secondary | ICD-10-CM | POA: Diagnosis not present

## 2015-12-20 DIAGNOSIS — F331 Major depressive disorder, recurrent, moderate: Secondary | ICD-10-CM | POA: Diagnosis not present

## 2015-12-21 ENCOUNTER — Encounter (HOSPITAL_COMMUNITY): Payer: Self-pay | Admitting: Licensed Clinical Social Worker

## 2015-12-21 NOTE — Progress Notes (Signed)
Daily Group Progress Note Program:  Outpatient  Group Time: 5:30-6:30 pm Participation Level: Active Behavioral Response: Appropriate Type of Therapy:  Psychoeducation/Therapy Summary of Progress: Pt participated in a discussion on feeling emotionally overwhelmed. Pt expressed her state of being with intense emotions that are difficult to manage, affect her ability to think and act rationally or perform in a sufficient and functional manner. Pt was encouraged to work through key issues, trying to discover the roots of her overwhelming emotions and explore ways to prevent her emotions from becoming overwhelming, and use her coping skills to deal with potential emotional stressors that cannot be prevented. Pt was open to suggestions during the intervention.  Delanda Bulluck S. Illeana Edick, LCAS  

## 2015-12-24 ENCOUNTER — Other Ambulatory Visit: Payer: Self-pay | Admitting: Internal Medicine

## 2015-12-25 ENCOUNTER — Ambulatory Visit (HOSPITAL_COMMUNITY): Payer: 59 | Admitting: Licensed Clinical Social Worker

## 2015-12-25 DIAGNOSIS — F411 Generalized anxiety disorder: Secondary | ICD-10-CM

## 2015-12-25 DIAGNOSIS — F331 Major depressive disorder, recurrent, moderate: Secondary | ICD-10-CM

## 2015-12-26 ENCOUNTER — Encounter (HOSPITAL_COMMUNITY): Payer: Self-pay | Admitting: Licensed Clinical Social Worker

## 2015-12-26 NOTE — Progress Notes (Signed)
Daily Group Progress Note Program:  Outpatient  Group Time: 5:30-6:30 pm Participation Level: Active Behavioral Response: Appropriate Type of Therapy:  Psychoeducation/Therapy Summary of Progress: Pt participated in a discussion on how others may affect  her emotional health by affecting her thoughts, feelings and even behaviors. Pt struggles with the effects her daughter's addiction has on the family, especially herself. Pt was encouraged to continue using her healthy ways (boundaries) to cope with stress and problems that are a normal part of life.   Vernona RiegerLisbeth S. Mackenzie, LCAS

## 2015-12-27 ENCOUNTER — Ambulatory Visit (HOSPITAL_COMMUNITY): Payer: Self-pay | Admitting: Licensed Clinical Social Worker

## 2016-01-01 ENCOUNTER — Ambulatory Visit (INDEPENDENT_AMBULATORY_CARE_PROVIDER_SITE_OTHER): Payer: 59 | Admitting: Licensed Clinical Social Worker

## 2016-01-01 DIAGNOSIS — F411 Generalized anxiety disorder: Secondary | ICD-10-CM | POA: Diagnosis not present

## 2016-01-01 DIAGNOSIS — F331 Major depressive disorder, recurrent, moderate: Secondary | ICD-10-CM

## 2016-01-03 ENCOUNTER — Other Ambulatory Visit: Payer: Self-pay | Admitting: Internal Medicine

## 2016-01-03 ENCOUNTER — Encounter (HOSPITAL_COMMUNITY): Payer: Self-pay | Admitting: Licensed Clinical Social Worker

## 2016-01-03 ENCOUNTER — Ambulatory Visit (INDEPENDENT_AMBULATORY_CARE_PROVIDER_SITE_OTHER): Payer: 59 | Admitting: Licensed Clinical Social Worker

## 2016-01-03 DIAGNOSIS — F411 Generalized anxiety disorder: Secondary | ICD-10-CM

## 2016-01-03 DIAGNOSIS — F331 Major depressive disorder, recurrent, moderate: Secondary | ICD-10-CM | POA: Diagnosis not present

## 2016-01-03 NOTE — Progress Notes (Signed)
Daily Group Progress Note Program:  Outpatient   Group Time: 5:30-6:30 pm  Participation Level: Active  Behavioral Response: Appropriate  Type of Therapy:  Psychoeducation/Therapy  Summary of Progress: Pt participated in a discussion on the effects of relationships on mental health recovery. Normally, persons with mental illness have small social networks than others and have more family members than friends in their relationship circles. Pt was encouraged to widen her circle of relationships by working more on her own self-care and coping skills for mental wellness.  Cachet Mccutchen S. Adriana Lina, LCAS 

## 2016-01-04 ENCOUNTER — Encounter (HOSPITAL_COMMUNITY): Payer: Self-pay | Admitting: Licensed Clinical Social Worker

## 2016-01-04 NOTE — Progress Notes (Signed)
Daily Group Progress Note Program:  Outpatient   Group Time: 5:30-6:30 pm  Participation Level: Active  Behavioral Response: Appropriate  Type of Therapy:  Psychoeducation/Therapy  Summary of Progress:  Pt participated in a discussion of mental wellness during the holiday season. It was noted during the discussion that anxiety and depression may become more prevalent during the Christmas holiday especially people who already live with a mental health condition. Pt was encouraged to take extra care in tending to her overall health and wellness during this time. Linard Daft S. Maimouna Rondeau, LCAS 

## 2016-01-08 ENCOUNTER — Ambulatory Visit (HOSPITAL_COMMUNITY): Payer: Self-pay | Admitting: Licensed Clinical Social Worker

## 2016-01-10 ENCOUNTER — Telehealth (HOSPITAL_COMMUNITY): Payer: Self-pay | Admitting: Licensed Clinical Social Worker

## 2016-01-10 ENCOUNTER — Ambulatory Visit (HOSPITAL_COMMUNITY): Payer: Self-pay | Admitting: Licensed Clinical Social Worker

## 2016-01-15 ENCOUNTER — Ambulatory Visit (HOSPITAL_COMMUNITY): Payer: Self-pay | Admitting: Licensed Clinical Social Worker

## 2016-01-17 ENCOUNTER — Ambulatory Visit (INDEPENDENT_AMBULATORY_CARE_PROVIDER_SITE_OTHER): Payer: 59 | Admitting: Licensed Clinical Social Worker

## 2016-01-17 DIAGNOSIS — F331 Major depressive disorder, recurrent, moderate: Secondary | ICD-10-CM

## 2016-01-17 DIAGNOSIS — F411 Generalized anxiety disorder: Secondary | ICD-10-CM

## 2016-01-18 ENCOUNTER — Encounter (HOSPITAL_COMMUNITY): Payer: Self-pay | Admitting: Licensed Clinical Social Worker

## 2016-01-18 NOTE — Progress Notes (Signed)
Daily Group Progress Note Program:  Outpatient  Group Time: 5:30-6:30 pm  Participation Level: Active  Behavioral Response: Appropriate  Type of Therapy:  Psychoeducation/Therapy  Summary of Progress: Pt participated in a discussion entitled "What is your word for the new year, from the book One Word that Will Change your Life?" Pt shared how one word can create clarity, power, passion and life-change. One word - that impacts all dimensions of her life - mental, physical, emotional relational and spiritual. Her word was now - living in the moment. Pt was encouraged to find small successes during the year by using her "word" and a roadmap. Pt was an active participant during the intervention and open to suggestions made by group members.  Vernona RiegerLisbeth S. Milik Gilreath, LCAS

## 2016-01-22 ENCOUNTER — Ambulatory Visit (INDEPENDENT_AMBULATORY_CARE_PROVIDER_SITE_OTHER): Payer: 59 | Admitting: Licensed Clinical Social Worker

## 2016-01-22 DIAGNOSIS — F411 Generalized anxiety disorder: Secondary | ICD-10-CM

## 2016-01-22 DIAGNOSIS — F331 Major depressive disorder, recurrent, moderate: Secondary | ICD-10-CM | POA: Diagnosis not present

## 2016-01-23 ENCOUNTER — Encounter (HOSPITAL_COMMUNITY): Payer: Self-pay | Admitting: Licensed Clinical Social Worker

## 2016-01-23 NOTE — Progress Notes (Signed)
Daily Group Progress Note Program:  Outpatient   Group Time: 5:30-6:30 pm  Participation Level: Active  Behavioral Response: Appropriate  Type of Therapy:  Psychoeducation/Therapy  Summary of Progress:  Pt participated in a discussion on the mind/body connection. Pt recognized that being stressed, anxious or upset by others affects her own emotional well-being. Pt was able to identify ways she could improve her emotional health by recognizing her emotions and understanding why she is having them and what she has control over to have the ability to change. Pt actively participated in the intervention.  Lisbeth S. Mackenzie, LCAS 

## 2016-01-24 ENCOUNTER — Ambulatory Visit (INDEPENDENT_AMBULATORY_CARE_PROVIDER_SITE_OTHER): Payer: 59 | Admitting: Licensed Clinical Social Worker

## 2016-01-24 DIAGNOSIS — F411 Generalized anxiety disorder: Secondary | ICD-10-CM

## 2016-01-24 DIAGNOSIS — F331 Major depressive disorder, recurrent, moderate: Secondary | ICD-10-CM

## 2016-01-29 ENCOUNTER — Encounter (HOSPITAL_COMMUNITY): Payer: Self-pay | Admitting: Licensed Clinical Social Worker

## 2016-01-29 ENCOUNTER — Ambulatory Visit (INDEPENDENT_AMBULATORY_CARE_PROVIDER_SITE_OTHER): Payer: 59 | Admitting: Licensed Clinical Social Worker

## 2016-01-29 DIAGNOSIS — F331 Major depressive disorder, recurrent, moderate: Secondary | ICD-10-CM

## 2016-01-29 DIAGNOSIS — F411 Generalized anxiety disorder: Secondary | ICD-10-CM | POA: Diagnosis not present

## 2016-01-29 NOTE — Progress Notes (Signed)
Daily Group Progress Note Program:  Outpatient  Group Time: 5:30-6:30 pm  Participation Level: Active  Behavioral Response: Appropriate  Type of Therapy:  Psychoeducation/Therapy  Summary of Progress: Pt participated in a discussion on "finding purpose in my life" during mental health recovery. While suffering from a mental illness pt must satisfy herself spiritually, emotionally and mentally. The recovery process itself sparks passion to inspire growth. Pt was encouraged to continue to work on her self-discovery which plays a role in her recovery process. Pt was actively engaged in the intervention.    Tyiesha Brackney S. Yvetta Drotar, LCAS 

## 2016-01-31 ENCOUNTER — Ambulatory Visit (HOSPITAL_COMMUNITY): Payer: Self-pay | Admitting: Licensed Clinical Social Worker

## 2016-02-01 ENCOUNTER — Encounter (HOSPITAL_COMMUNITY): Payer: Self-pay | Admitting: Licensed Clinical Social Worker

## 2016-02-01 NOTE — Progress Notes (Signed)
Daily Group Progress Note Program:  Outpatient  Group Time: 5:30-6:30 pm Participation Level: Active Behavioral Response: Appropriate Type of Therapy:  Psychoeducation/Therapy Summary of Progress: Pt participated in a discussion on personal responsibility as one of the key concepts to recovery. Pt has been able to take action and do what needs to get done to get well and stay well. It was suggested to pt that taking ownership of her life and future is her first step to gain back control of her life. Pt was active during the intervention and was open to suggestions.  Dashan Chizmar S. Tiquan Bouch, LCAS 

## 2016-02-05 ENCOUNTER — Ambulatory Visit (INDEPENDENT_AMBULATORY_CARE_PROVIDER_SITE_OTHER): Payer: 59 | Admitting: Licensed Clinical Social Worker

## 2016-02-05 DIAGNOSIS — F411 Generalized anxiety disorder: Secondary | ICD-10-CM | POA: Diagnosis not present

## 2016-02-05 DIAGNOSIS — F331 Major depressive disorder, recurrent, moderate: Secondary | ICD-10-CM

## 2016-02-06 ENCOUNTER — Encounter (HOSPITAL_COMMUNITY): Payer: Self-pay | Admitting: Licensed Clinical Social Worker

## 2016-02-06 NOTE — Progress Notes (Signed)
Daily Group Progress Note Program:  Outpatient  Group Time: 5:30-6:30 pm  Participation Level: Active  Behavioral Response: Appropriate  Type of Therapy: Psychoeducation/Therapy  Summary of Progress: Pt participated in a discussion on "Moving forward in change" during mental health recovery. Mental health recovery can be a challenge or even stalled but "I must always be open to change" by not letting the mental health diagnosis define me. Pt was encouraged to continue to come to a place of acceptance. Without acceptance, there is no peace. The first step in recovery. Pt was engaged in the intervention.  Lisbeth S. Mackenzie, LCAS      

## 2016-02-07 ENCOUNTER — Ambulatory Visit (INDEPENDENT_AMBULATORY_CARE_PROVIDER_SITE_OTHER): Payer: 59 | Admitting: Licensed Clinical Social Worker

## 2016-02-07 DIAGNOSIS — F331 Major depressive disorder, recurrent, moderate: Secondary | ICD-10-CM | POA: Diagnosis not present

## 2016-02-07 DIAGNOSIS — F411 Generalized anxiety disorder: Secondary | ICD-10-CM | POA: Diagnosis not present

## 2016-02-08 ENCOUNTER — Encounter (HOSPITAL_COMMUNITY): Payer: Self-pay | Admitting: Licensed Clinical Social Worker

## 2016-02-08 NOTE — Progress Notes (Signed)
Daily Group Progress Note Program:  Outpatient  Group Time: 5:30-6:30 pm Participation Level: Active Behavioral Response: Appropriate Type of Therapy:  Psychoeducation/Therapy Summary of Progress: Pt participated in a discussion on anger. Pt participated in the "umbrella activity" where she identified feelings where she reacted using anger.  Pt was encouraged to continue using her healthy ways to cope with anger as a normal part of life. Pt actively participated in the intervention.  Leobardo Granlund S. Miski Feldpausch, LCAS  

## 2016-02-12 ENCOUNTER — Ambulatory Visit (INDEPENDENT_AMBULATORY_CARE_PROVIDER_SITE_OTHER): Payer: 59 | Admitting: Licensed Clinical Social Worker

## 2016-02-12 DIAGNOSIS — F411 Generalized anxiety disorder: Secondary | ICD-10-CM

## 2016-02-12 DIAGNOSIS — F331 Major depressive disorder, recurrent, moderate: Secondary | ICD-10-CM

## 2016-02-14 ENCOUNTER — Ambulatory Visit (INDEPENDENT_AMBULATORY_CARE_PROVIDER_SITE_OTHER): Payer: 59 | Admitting: Licensed Clinical Social Worker

## 2016-02-14 ENCOUNTER — Encounter (HOSPITAL_COMMUNITY): Payer: Self-pay | Admitting: Licensed Clinical Social Worker

## 2016-02-14 DIAGNOSIS — F411 Generalized anxiety disorder: Secondary | ICD-10-CM

## 2016-02-14 DIAGNOSIS — F331 Major depressive disorder, recurrent, moderate: Secondary | ICD-10-CM | POA: Diagnosis not present

## 2016-02-14 NOTE — Progress Notes (Signed)
Daily Group Progress Note Program:  Outpatient  Group Time: 5:30-6:30 pm  Participation Level: Active  Behavioral Response: Appropriate  Type of Therapy:  Psychoeducation/Therapy  Summary of Progress: Pt participated in a discussion on "Staying focused while in mental health recovery."  Pt chose recovery and set goals to strive for; however, she has encountered obstacles that have tried to interfere with her road to recovery. Pt was encouraged to stay focused on her recovery even amidst hardships and hindrances. Pt was open to suggestions during the intervention. Cleaster Shiffer S. Agastya Meister, LCAS 

## 2016-02-15 ENCOUNTER — Encounter (HOSPITAL_COMMUNITY): Payer: Self-pay | Admitting: Licensed Clinical Social Worker

## 2016-02-15 NOTE — Progress Notes (Signed)
Daily Group Progress Note Program:  Outpatient  Group Time: 5:30-6:30 pm  Participation Level: Active  Behavioral Response: Appropriate  Type of Therapy:  Psychoeducation/Therapy  Summary of Progress:  Pt participated in a discussion on "Acceptance of a mental health problem." Defining acceptance as a process of recognizing and dealing with symptoms and challenges. Pt acknowledge that acceptance is a process and overcoming challenges can be difficult without support. Pt was encouraged to continue to work on the challenges of a mental health diagnosis and to work towards mental wellness. Pt was receptive to suggestions during the intervention.  Lisbeth S. Mackenzie, LCAS 

## 2016-02-20 ENCOUNTER — Ambulatory Visit (INDEPENDENT_AMBULATORY_CARE_PROVIDER_SITE_OTHER): Payer: 59 | Admitting: Licensed Clinical Social Worker

## 2016-02-20 DIAGNOSIS — F331 Major depressive disorder, recurrent, moderate: Secondary | ICD-10-CM

## 2016-02-20 DIAGNOSIS — F411 Generalized anxiety disorder: Secondary | ICD-10-CM | POA: Diagnosis not present

## 2016-02-21 ENCOUNTER — Encounter (HOSPITAL_COMMUNITY): Payer: Self-pay | Admitting: Licensed Clinical Social Worker

## 2016-02-21 NOTE — Progress Notes (Signed)
Daily Group Progress Note Program:  Outpatient  Group Time: 5:30-6:30 pm  Participation Level: Active  Behavioral Response: Appropriate  Type of Therapy:  Psychoeducation/Therapy  Summary of Progress:  Pt participated in a discussion on 4 personal steps to being happy and stable in mental health recovery (recognition, understanding, triggers identified, healthy changes). Pt identified where she is on her journey to being happy and stable in her road to healthy changes in mental health recovery. She reports that she is working on healthy changes by reconnecting with friends and working on her self care. She wants to get back to where she used to be be prior to her depression and anxiety spiraling.  Pt was engaged in the intervention.  Vernona RiegerLisbeth S. Sharvil Hoey, LCAS

## 2016-02-27 ENCOUNTER — Ambulatory Visit (INDEPENDENT_AMBULATORY_CARE_PROVIDER_SITE_OTHER): Payer: 59 | Admitting: Licensed Clinical Social Worker

## 2016-02-27 DIAGNOSIS — F331 Major depressive disorder, recurrent, moderate: Secondary | ICD-10-CM | POA: Diagnosis not present

## 2016-02-27 DIAGNOSIS — F411 Generalized anxiety disorder: Secondary | ICD-10-CM

## 2016-02-29 ENCOUNTER — Encounter (HOSPITAL_COMMUNITY): Payer: Self-pay | Admitting: Licensed Clinical Social Worker

## 2016-02-29 NOTE — Progress Notes (Signed)
Daily Group Progress Note Program:  Outpatient  Group Time: 5:30-6:30 pm  Participation Level: Active  Behavioral Response: Appropriate  Type of Therapy:  Psychoeducation/Therapy  Summary of Progress:  Pt was the only member present for group. She participated in a discussion on self care. Pt has been so wrapped up with her daughter's life that she has not taken care of her self. Stressed the importance of self care for someone with a mental health disorder. She reports she is working on positive steps in her life for personal self care. She is going to visit her sister in [place], reconnected with friends and engaged in walking again. Encouranged pt to continue with her personal self care plan  Sarah RiegerLisbeth S. Mackenzie, LCAS

## 2016-03-05 ENCOUNTER — Ambulatory Visit (HOSPITAL_COMMUNITY): Payer: Self-pay | Admitting: Licensed Clinical Social Worker

## 2016-03-12 ENCOUNTER — Ambulatory Visit (HOSPITAL_COMMUNITY): Payer: Self-pay | Admitting: Licensed Clinical Social Worker

## 2016-03-19 ENCOUNTER — Ambulatory Visit (INDEPENDENT_AMBULATORY_CARE_PROVIDER_SITE_OTHER): Payer: 59 | Admitting: Licensed Clinical Social Worker

## 2016-03-19 DIAGNOSIS — F331 Major depressive disorder, recurrent, moderate: Secondary | ICD-10-CM

## 2016-03-19 DIAGNOSIS — F411 Generalized anxiety disorder: Secondary | ICD-10-CM

## 2016-03-20 ENCOUNTER — Encounter (HOSPITAL_COMMUNITY): Payer: Self-pay | Admitting: Licensed Clinical Social Worker

## 2016-03-20 NOTE — Progress Notes (Signed)
Daily Group Progress Note     Program: OP   Group Time: 5:30-6:30  Participation Level: Active  Behavioral Response: Appropriate  Type of Therapy:  Psychoeducation/Therapy  Summary of Progress: Pt participated in a discussion on "putting ourselves first," as part of self-care. Pt agreed that in order to be helpful to others you must be the best you can be, by taking some time out for yourself on occasion. Encouraged pt to be ok with putting herself first . Pt was receptive to the intervention and suggestions.  Taccara Bushnell S. Venisa Frampton, LCAS 

## 2016-03-25 ENCOUNTER — Other Ambulatory Visit: Payer: Self-pay | Admitting: Internal Medicine

## 2016-03-26 ENCOUNTER — Ambulatory Visit (INDEPENDENT_AMBULATORY_CARE_PROVIDER_SITE_OTHER): Payer: 59 | Admitting: Licensed Clinical Social Worker

## 2016-03-26 DIAGNOSIS — F411 Generalized anxiety disorder: Secondary | ICD-10-CM | POA: Diagnosis not present

## 2016-03-26 DIAGNOSIS — F331 Major depressive disorder, recurrent, moderate: Secondary | ICD-10-CM | POA: Diagnosis not present

## 2016-03-28 ENCOUNTER — Encounter (HOSPITAL_COMMUNITY): Payer: Self-pay | Admitting: Licensed Clinical Social Worker

## 2016-03-28 NOTE — Progress Notes (Signed)
Daily Group Progress Note     Program: OP   Group Time: 5:30-6:30  Participation Level: Active  Behavioral Response: Appropriate  Type of Therapy:  Psychoeducation/Therapy  Summary of Progress: Pt participated in a discussion on "refection of progress made in outpatient group." Pt is completing the program today and will continue with individual therapy. Pt shared how much she learned about herself, putting herself first, coping tools, asking for help, and "letting go" of her daughter's many issues. She was grateful for the opportunity to be a part of a successful support and psychoeducational therapy group. She has made friends with the like-minded members of the group and has learned many coping tools to continue in her mental wellness recovery.  Vernona RiegerLisbeth S. Dione Petron, LCAS

## 2016-04-02 ENCOUNTER — Ambulatory Visit (HOSPITAL_COMMUNITY): Payer: Self-pay | Admitting: Licensed Clinical Social Worker

## 2016-04-09 ENCOUNTER — Ambulatory Visit (HOSPITAL_COMMUNITY): Payer: Self-pay | Admitting: Licensed Clinical Social Worker

## 2016-06-26 ENCOUNTER — Other Ambulatory Visit: Payer: Self-pay | Admitting: Internal Medicine

## 2016-07-05 ENCOUNTER — Encounter: Payer: Self-pay | Admitting: Internal Medicine

## 2016-07-05 ENCOUNTER — Ambulatory Visit (INDEPENDENT_AMBULATORY_CARE_PROVIDER_SITE_OTHER): Payer: Managed Care, Other (non HMO) | Admitting: Internal Medicine

## 2016-07-05 VITALS — BP 118/64 | HR 72 | Temp 98.1°F | Ht 60.0 in | Wt 157.6 lb

## 2016-07-05 DIAGNOSIS — E039 Hypothyroidism, unspecified: Secondary | ICD-10-CM | POA: Diagnosis not present

## 2016-07-05 DIAGNOSIS — Z0000 Encounter for general adult medical examination without abnormal findings: Secondary | ICD-10-CM | POA: Diagnosis not present

## 2016-07-05 DIAGNOSIS — E785 Hyperlipidemia, unspecified: Secondary | ICD-10-CM | POA: Diagnosis not present

## 2016-07-05 LAB — COMPREHENSIVE METABOLIC PANEL
ALK PHOS: 64 U/L (ref 39–117)
ALT: 9 U/L (ref 0–35)
AST: 10 U/L (ref 0–37)
Albumin: 4.2 g/dL (ref 3.5–5.2)
BUN: 11 mg/dL (ref 6–23)
CHLORIDE: 105 meq/L (ref 96–112)
CO2: 31 mEq/L (ref 19–32)
Calcium: 9.6 mg/dL (ref 8.4–10.5)
Creatinine, Ser: 0.77 mg/dL (ref 0.40–1.20)
GFR: 82.01 mL/min (ref >60.00)
Glucose, Bld: 124 mg/dL — ABNORMAL HIGH (ref 70–99)
POTASSIUM: 4.6 meq/L (ref 3.5–5.1)
SODIUM: 142 meq/L (ref 135–145)
TOTAL PROTEIN: 6.8 g/dL (ref 6.0–8.3)
Total Bilirubin: 0.3 mg/dL (ref 0.2–1.2)

## 2016-07-05 LAB — CBC WITH DIFFERENTIAL/PLATELET
BASOS PCT: 0.6 % (ref 0.0–3.0)
Basophils Absolute: 0 10*3/uL (ref 0.0–0.1)
EOS PCT: 3.5 % (ref 0.0–5.0)
Eosinophils Absolute: 0.3 10*3/uL (ref 0.0–0.7)
HCT: 41.3 % (ref 36.0–46.0)
Hemoglobin: 13.7 g/dL (ref 12.0–15.0)
LYMPHS ABS: 2.2 10*3/uL (ref 0.7–4.0)
Lymphocytes Relative: 29.5 % (ref 12.0–46.0)
MCHC: 33.2 g/dL (ref 30.0–36.0)
MCV: 86.2 fl (ref 78.0–100.0)
MONO ABS: 0.6 10*3/uL (ref 0.1–1.0)
Monocytes Relative: 8.1 % (ref 3.0–12.0)
NEUTROS PCT: 58.3 % (ref 43.0–77.0)
Neutro Abs: 4.3 10*3/uL (ref 1.4–7.7)
Platelets: 287 10*3/uL (ref 150.0–400.0)
RBC: 4.79 Mil/uL (ref 3.87–5.11)
RDW: 13.9 % (ref 11.5–15.5)
WBC: 7.4 10*3/uL (ref 4.0–10.5)

## 2016-07-05 LAB — LIPID PANEL
Cholesterol: 189 mg/dL (ref 0–200)
HDL: 42.1 mg/dL (ref >39.00)
LDL Cholesterol: 117 mg/dL — ABNORMAL HIGH (ref 0–99)
NONHDL: 146.58
Total CHOL/HDL Ratio: 4
Triglycerides: 148 mg/dL (ref 0.0–149.0)
VLDL: 29.6 mg/dL (ref 0.0–40.0)

## 2016-07-05 LAB — TSH: TSH: 1.13 u[IU]/mL (ref 0.35–4.50)

## 2016-07-05 MED ORDER — LEVOTHYROXINE SODIUM 75 MCG PO TABS: 75.0000 ug | ORAL_TABLET | Freq: Every day | ORAL | 3 refills | Status: DC

## 2016-07-05 MED ORDER — SIMVASTATIN 20 MG PO TABS: ORAL_TABLET | ORAL | 4 refills | Status: DC

## 2016-07-05 NOTE — Patient Instructions (Addendum)
WE NOW OFFER   [facility] FAST TRACK!!!  SAME DAY Appointments for ACUTE CARE  Such as: Sprains, Injuries, cuts, abrasions, rashes, muscle pain, joint pain, back pain Colds, flu, sore throats, headache, allergies, cough, fever  Ear pain, sinus and eye infections Abdominal pain, nausea, vomiting, diarrhea, upset stomach Animal/insect bites  3 Easy Ways to Schedule: Walk-In Scheduling Call in scheduling Mychart Sign-up: [email or web address]   Please check your blood pressure on a regular basis.  If it is consistently greater than 150/90, please make an office appointment.    It is important that you exercise regularly, at least 20 minutes 3 to 4 times per week.  If you develop chest pain or shortness of breath seek  medical attention.  Schedule your mammogram.  Return in 6 months for follow-up

## 2016-07-05 NOTE — Progress Notes (Signed)
Subjective:    Patient ID: Sarah Wagner, female    DOB: January 22, 1960, 56 y.o.   MRN: [id number]  HPI  56 year old patient who is seen today for follow-up.  She has not been seen in over one year.  She has a history of anxiety, depression and is followed by behavioral health as well as psychiatry [facility]).  She is still well.  She is on Celexa 60 mg daily.  She rarely uses lorazepam.  She has hypothyroidism as well as dyslipidemia. No new concerns or complaints.  Past Medical History:  Diagnosis Date  . Allergy   . Anxiety   . Depression   . Headache(784.0)   . Hyperlipidemia   . Thyroid disease      Social History   Social History  . Marital status: Married    Spouse name: N/A  . Number of children: N/A  . Years of education: N/A   Occupational History  . Not on file.   Social History Main Topics  . Smoking status: Never Smoker  . Smokeless tobacco: Never Used  . Alcohol use No  . Drug use: No  . Sexual activity: Not Currently   Other Topics Concern  . Not on file   Social History Narrative  . No narrative on file    Past Surgical History:  Procedure Laterality Date  . ABDOMINAL HYSTERECTOMY    . HERNIA REPAIR      Family History  Problem Relation Age of Onset  . Cancer Mother        ovarian  . Parkinsonism Mother   . Depression Mother   . Anxiety disorder Mother   . Cancer Father        brain  . Diabetes Father   . Crohn's disease Father   . Graves' disease Father   . Hyperlipidemia Father   . Depression Father   . Anxiety disorder Father   . Crohn's disease Sister     Allergies  Allergen Reactions  . Sulfacetamide Sodium     Current Outpatient Prescriptions on File Prior to Visit  Medication Sig Dispense Refill  . levothyroxine (SYNTHROID, LEVOTHROID) 75 MCG tablet TAKE 1 TABLET BY MOUTH EVERY DAY 30 tablet 0  . oxymetazoline (AFRIN) 0.05 % nasal spray Place 1 spray into both nostrils 2 (two) times daily as needed for congestion.    .  simvastatin (ZOCOR) 20 MG tablet TAKE 1 TABLET (20 MG TOTAL) BY MOUTH AT BEDTIME. 90 tablet 1  . tretinoin (RETIN-A) 0.05 % cream Apply topically at bedtime. 45 g 4  . citalopram (CELEXA) 40 MG tablet Take 1 tablet (40 mg total) by mouth daily. 30 tablet 2   No current facility-administered medications on file prior to visit.     BP 118/64 (BP Location: Left Arm, Patient Position: Sitting, Cuff Size: Normal)   Pulse 72   Temp 98.1 F (36.7 C) (Oral)   Ht 5' (1.524 m)   Wt 157 lb 9.6 oz (71.5 kg)   SpO2 97%   BMI 30.78 kg/m     Review of Systems  Psychiatric/Behavioral: Positive for dysphoric mood. The patient is nervous/anxious.        Objective:   Physical Exam  Constitutional: She is oriented to person, place, and time. She appears well-developed and well-nourished.  Repeat blood pressure 120/76  HENT:  Head: Normocephalic.  Right Ear: External ear normal.  Left Ear: External ear normal.  Mouth/Throat: Oropharynx is clear and moist.  Eyes: Conjunctivae and EOM are normal.  Pupils are equal, round, and reactive to light.  Neck: Normal range of motion. Neck supple. No thyromegaly present.  Cardiovascular: Normal rate, regular rhythm, normal heart sounds and intact distal pulses.   Pulmonary/Chest: Effort normal and breath sounds normal.  Abdominal: Soft. Bowel sounds are normal. She exhibits no mass. There is no tenderness.  Musculoskeletal: Normal range of motion.  Lymphadenopathy:    She has no cervical adenopathy.  Neurological: She is alert and oriented to person, place, and time.  Skin: Skin is warm and dry. No rash noted.  Psychiatric: She has a normal mood and affect. Her behavior is normal.          Assessment & Plan:  Dyslipidemia.  Will review a lipid profile Hypothyroidism.  We'll check TSH Anxiety, depression.  Follow behavioral health and psychiatry  Follow-up 6 months or as needed  Rogelia BogaKWIATKOWSKI,Metztli Sachdev FRANK

## 2016-07-26 ENCOUNTER — Encounter: Payer: Self-pay | Admitting: Internal Medicine

## 2016-07-26 ENCOUNTER — Ambulatory Visit (INDEPENDENT_AMBULATORY_CARE_PROVIDER_SITE_OTHER): Payer: Managed Care, Other (non HMO) | Admitting: Internal Medicine

## 2016-07-26 VITALS — BP 112/78 | HR 90 | Temp 98.6°F | Ht 60.0 in | Wt 155.8 lb

## 2016-07-26 DIAGNOSIS — B9789 Other viral agents as the cause of diseases classified elsewhere: Secondary | ICD-10-CM

## 2016-07-26 DIAGNOSIS — J069 Acute upper respiratory infection, unspecified: Secondary | ICD-10-CM

## 2016-07-26 NOTE — Patient Instructions (Addendum)
WE NOW OFFER   [facility] FAST TRACK!!!  SAME DAY Appointments for ACUTE CARE  Such as: Sprains, Injuries, cuts, abrasions, rashes, muscle pain, joint pain, back pain Colds, flu, sore throats, headache, allergies, cough, fever  Ear pain, sinus and eye infections Abdominal pain, nausea, vomiting, diarrhea, upset stomach Animal/insect bites  3 Easy Ways to Schedule: Walk-In Scheduling Call in scheduling Mychart Sign-up: [email or web address]   Acute bronchitis symptoms for less than 10 days are generally not helped by antibiotics.  Take over-the-counter expectorants and cough medications such as  Mucinex DM.  Call if there is no improvement in 5 to 7 days or if  you develop worsening cough, fever, or new symptoms, such as shortness of breath or chest pain.    Hydrate and Humidify  Drink enough water to keep your urine clear or pale yellow. Staying hydrated will help to thin your mucus.  Use a cool mist humidifier to keep the humidity level in your home above 50%.  Inhale steam for 10-15 minutes, 3-4 times a day or as told by your health care provider. You can do this in the bathroom while a hot shower is running.  Limit your exposure to cool or dry air. Rest  Rest as much as possible.    Topical nasal decongestants. They shrink swollen nasal passages and let mucus drain from your sinuses.  Antihistamines. These drugs block inflammation that is triggered by allergies. This can help to ease swelling in your nose and sinuses.  Nasal saline washes. These rinses can help to get rid of thick mucus in your nose.

## 2016-07-26 NOTE — Progress Notes (Signed)
Subjective:    Patient ID: Sarah Wagner, female    DOB: [birth year], 56 y.o.   MRN: [id number]  HPI 56 year old patient who presents with a three-day history ofchest congestion, sinus congestion, mild cough.  She has had some episodic shortness of breath but no wheezing.  There's been slight sore throat and intermittent low-grade fever.  She has been using ibuprofen and Robitussin  Past Medical History:  Diagnosis Date  . Allergy   . Anxiety   . Depression   . Headache(784.0)   . Hyperlipidemia   . Thyroid disease      Social History   Social History  . Marital status: Married    Spouse name: N/A  . Number of children: N/A  . Years of education: N/A   Occupational History  . Not on file.   Social History Main Topics  . Smoking status: Never Smoker  . Smokeless tobacco: Never Used  . Alcohol use No  . Drug use: No  . Sexual activity: Not Currently   Other Topics Concern  . Not on file   Social History Narrative  . No narrative on file    Past Surgical History:  Procedure Laterality Date  . ABDOMINAL HYSTERECTOMY    . HERNIA REPAIR      Family History  Problem Relation Age of Onset  . Cancer Mother        ovarian  . Parkinsonism Mother   . Depression Mother   . Anxiety disorder Mother   . Cancer Father        brain  . Diabetes Father   . Crohn's disease Father   . Graves' disease Father   . Hyperlipidemia Father   . Depression Father   . Anxiety disorder Father   . Crohn's disease Sister     Allergies  Allergen Reactions  . Sulfacetamide Sodium     Current Outpatient Prescriptions on File Prior to Visit  Medication Sig Dispense Refill  . levothyroxine (SYNTHROID, LEVOTHROID) 75 MCG tablet Take 1 tablet (75 mcg total) by mouth daily. 90 tablet 3  . oxymetazoline (AFRIN) 0.05 % nasal spray Place 1 spray into both nostrils 2 (two) times daily as needed for congestion.    . simvastatin (ZOCOR) 20 MG tablet TAKE 1 TABLET (20 MG TOTAL) BY MOUTH AT  BEDTIME. 90 tablet 4  . tretinoin (RETIN-A) 0.05 % cream Apply topically at bedtime. 45 g 4  . citalopram (CELEXA) 40 MG tablet Take 1 tablet (40 mg total) by mouth daily. 30 tablet 2   No current facility-administered medications on file prior to visit.     BP 112/78 (BP Location: Left Arm, Patient Position: Sitting, Cuff Size: Normal)   Pulse 90   Temp 98.6 F (37 C) (Oral)   Ht 5' (1.524 m)   Wt 155 lb 12.8 oz (70.7 kg)   SpO2 98%   BMI 30.43 kg/m      Review of Systems  Constitutional: Positive for activity change, appetite change and fatigue.  HENT: Positive for congestion, postnasal drip, sinus pressure and sore throat. Negative for dental problem, hearing loss, rhinorrhea and tinnitus.   Eyes: Negative for pain, discharge and visual disturbance.  Respiratory: Positive for cough. Negative for shortness of breath and wheezing.   Cardiovascular: Negative for chest pain, palpitations and leg swelling.  Gastrointestinal: Negative for abdominal distention, abdominal pain, blood in stool, constipation, diarrhea, nausea and vomiting.  Genitourinary: Negative for difficulty urinating, dysuria, flank pain, frequency, hematuria, pelvic pain, urgency, vaginal  bleeding, vaginal discharge and vaginal pain.  Musculoskeletal: Negative for arthralgias, gait problem and joint swelling.  Skin: Negative for rash.  Neurological: Negative for dizziness, syncope, speech difficulty, weakness, numbness and headaches.  Hematological: Negative for adenopathy.  Psychiatric/Behavioral: Negative for agitation, behavioral problems and dysphoric mood. The patient is not nervous/anxious.        Objective:   Physical Exam  Constitutional: She is oriented to person, place, and time. She appears well-developed and well-nourished.  HENT:  Head: Normocephalic.  Right Ear: External ear normal.  Left Ear: External ear normal.  Oropharynx slightly injected  Eyes: Pupils are equal, round, and reactive to  light. Conjunctivae and EOM are normal.  Neck: Normal range of motion. Neck supple. No thyromegaly present.  Cardiovascular: Normal rate, regular rhythm, normal heart sounds and intact distal pulses.   Pulmonary/Chest: Effort normal and breath sounds normal. No respiratory distress. She has no wheezes. She has no rales.  Abdominal: Soft. Bowel sounds are normal. She exhibits no mass. There is no tenderness.  Musculoskeletal: Normal range of motion.  Lymphadenopathy:    She has no cervical adenopathy.  Neurological: She is alert and oriented to person, place, and time.  Skin: Skin is warm and dry. No rash noted.  Psychiatric: She has a normal mood and affect. Her behavior is normal.          Assessment & Plan:   Viral URI with cough.  Will treat symptomatically Hypothyroidism.  Continue levothyroxine supplement Dyslipidemia.  Continue statin therapy \ Rogelia Boga

## 2017-02-06 ENCOUNTER — Encounter (HOSPITAL_COMMUNITY): Payer: Self-pay | Admitting: Licensed Clinical Social Worker

## 2017-02-06 ENCOUNTER — Ambulatory Visit (INDEPENDENT_AMBULATORY_CARE_PROVIDER_SITE_OTHER): Payer: 59 | Admitting: Licensed Clinical Social Worker

## 2017-02-06 DIAGNOSIS — F4321 Adjustment disorder with depressed mood: Secondary | ICD-10-CM | POA: Diagnosis not present

## 2017-02-06 DIAGNOSIS — Z634 Disappearance and death of family member: Secondary | ICD-10-CM | POA: Diagnosis not present

## 2017-02-06 NOTE — Progress Notes (Signed)
   THERAPIST PROGRESS NOTE  Session Time: 4:10-5pm  Participation Level: Active  Behavioral Response: CasualAlert/Sad  Type of Therapy: Individual Therapy  Treatment Goals addressed: Coping  Interventions: Supportive  Summary: Sarah Wagner is a 57 y.o. female who presents for individual counseling appointment. Pt has not been to therapy in 9 months. Pt wanted to return for therapy due to her daughter' dying from a drug induced infection at age 58. Pt has begun the stages of grief. Pt talked about her grief, her daughter and how her daughter's addiction affected their family for so many years. Pt processed all of her feelings about the loss of her daughter.   Suicidal/Homicidal: Nowithout intent/plan  Therapist Response: Assessed pt's current functioning. Assisted pt processing the stages of grief, the effects daughter's addiction has affected the family unit, her feelings around the loss of her daughter.   Plan: Return again in 1 weeks.  Diagnosis: Axis I: Grief at the loss of a child    MACKENZIE,LISBETH S, LCAS 02/06/2017

## 2017-02-10 ENCOUNTER — Ambulatory Visit (INDEPENDENT_AMBULATORY_CARE_PROVIDER_SITE_OTHER): Payer: 59 | Admitting: Licensed Clinical Social Worker

## 2017-02-10 ENCOUNTER — Encounter (HOSPITAL_COMMUNITY): Payer: Self-pay | Admitting: Licensed Clinical Social Worker

## 2017-02-10 DIAGNOSIS — Z634 Disappearance and death of family member: Secondary | ICD-10-CM

## 2017-02-10 DIAGNOSIS — F4321 Adjustment disorder with depressed mood: Secondary | ICD-10-CM | POA: Diagnosis not present

## 2017-02-10 DIAGNOSIS — F411 Generalized anxiety disorder: Secondary | ICD-10-CM

## 2017-02-10 NOTE — Progress Notes (Signed)
   THERAPIST PROGRESS NOTE  Session Time: 2:10-3pm  Participation Level: Active  Behavioral Response: CasualAlert/Sad  Type of Therapy: Individual Therapy  Treatment Goals addressed: Coping  Interventions: Supportive  Summary: Sarah Wagner is a 57 y.o. female who presents for individual counseling appointment. Pt presents sad and tearful today. Pt reports a difficult weekend, which included her birthday without her daughter. Explained the grieving process to her. Talked with pt about various support groups for parents who have lost children to addiction. Asked open ended questions about her struggles moving forward in hr life.Taught pt mindfulness breathing activity.    Suicidal/Homicidal: Nowithout intent/plan  Therapist Response: Assessed pt's current functioning. Assisted pt processing the stages of grief, struggles with moving forward, her feelings around the loss of her daughter, mindfulness activity.  Plan: Return again in 1 weeks.  Diagnosis: Axis I: Grief at the loss of a child, Anxiety    MACKENZIE,LISBETH S, LCAS 02/10/2017

## 2017-02-17 ENCOUNTER — Encounter (HOSPITAL_COMMUNITY): Payer: Self-pay | Admitting: Licensed Clinical Social Worker

## 2017-02-17 ENCOUNTER — Ambulatory Visit (INDEPENDENT_AMBULATORY_CARE_PROVIDER_SITE_OTHER): Payer: 59 | Admitting: Licensed Clinical Social Worker

## 2017-02-17 DIAGNOSIS — F411 Generalized anxiety disorder: Secondary | ICD-10-CM

## 2017-02-17 DIAGNOSIS — F4321 Adjustment disorder with depressed mood: Secondary | ICD-10-CM

## 2017-02-17 DIAGNOSIS — Z634 Disappearance and death of family member: Secondary | ICD-10-CM | POA: Diagnosis not present

## 2017-02-17 NOTE — Progress Notes (Signed)
   THERAPIST PROGRESS NOTE  Session Time: 1:10-2pm  Participation Level: Active  Behavioral Response: CasualAlert/Sad  Type of Therapy: Individual Therapy  Treatment Goals addressed: Coping  Interventions: Supportive  Summary: Sarah PaganRebecca Caudle is a 57 y.o. female who presents for individual counseling appointment. Pt presents sad  today. Her family took her [place]'s ashes [place] with her parents.She was able to see old family friends and family. It was sad but good to be surrounded by love.Pt brought in her grief workbook and talked about stages of grief. Pt had also written a 2 page story about her grief, very creative in description. Pt will continue to work in her grief workbook and journal.   Suicidal/Homicidal: Nowithout intent/plan  Therapist Response: Assessed pt's current functioning. Assisted pt processing the stages of grief, grief workbook, burying her daughter, stages of grief.,   Plan: Return again in 1 week.  Diagnosis: Axis I: Grief at the loss of a child, Anxiety    Waymon Laser S, LCAS 02/17/2017

## 2017-02-19 ENCOUNTER — Ambulatory Visit (HOSPITAL_COMMUNITY): Payer: Self-pay | Admitting: Licensed Clinical Social Worker

## 2017-03-10 ENCOUNTER — Encounter (HOSPITAL_COMMUNITY): Payer: Self-pay | Admitting: Licensed Clinical Social Worker

## 2017-03-10 ENCOUNTER — Ambulatory Visit (INDEPENDENT_AMBULATORY_CARE_PROVIDER_SITE_OTHER): Payer: 59 | Admitting: Licensed Clinical Social Worker

## 2017-03-10 DIAGNOSIS — F419 Anxiety disorder, unspecified: Secondary | ICD-10-CM

## 2017-03-10 DIAGNOSIS — F4321 Adjustment disorder with depressed mood: Secondary | ICD-10-CM

## 2017-03-10 DIAGNOSIS — Z634 Disappearance and death of family member: Secondary | ICD-10-CM

## 2017-03-10 DIAGNOSIS — F411 Generalized anxiety disorder: Secondary | ICD-10-CM

## 2017-03-10 NOTE — Progress Notes (Signed)
   THERAPIST PROGRESS NOTE  Session Time: 1:10-2pm  Participation Level: Active  Behavioral Response: CasualAlert/Sad  Type of Therapy: Individual Therapy  Treatment Goals addressed: Coping  Interventions: Supportive  Summary: Sarah Wagner is a 57 y.o. female who presents for individual counseling appointment. Pt discussed her psychiatric symptoms and current life events. Pt is still struggling with symptoms of grief. Asked open ended questions and used emotional reflection. Pt has followed through with referral to Hospice for group support. Worked with pt using the Grief Recovery Method to assist pt with her amassed feelings.Pt continues to work in her grief workbook. Gave pt another workbook for her husband so they can use it together to help rebuild their life.   .   Suicidal/Homicidal: Nowithout intent/plan  Therapist Response: Assessed pt's current functioning. Assisted pt processing grief, grief workbook, grief recovery method to assist pt with her amass of feelings surrounding the death of her daughter.   Plan: Return again in 1 week.  Diagnosis: Axis I: Grief at the loss of a child, Anxiety    MACKENZIE,LISBETH S, LCAS 03/10/2017

## 2017-03-24 ENCOUNTER — Ambulatory Visit (HOSPITAL_COMMUNITY): Payer: Self-pay | Admitting: Licensed Clinical Social Worker

## 2017-03-26 ENCOUNTER — Ambulatory Visit (HOSPITAL_COMMUNITY): Payer: Self-pay | Admitting: Licensed Clinical Social Worker

## 2017-04-02 ENCOUNTER — Ambulatory Visit (INDEPENDENT_AMBULATORY_CARE_PROVIDER_SITE_OTHER): Payer: 59 | Admitting: Licensed Clinical Social Worker

## 2017-04-02 ENCOUNTER — Encounter (HOSPITAL_COMMUNITY): Payer: Self-pay | Admitting: Licensed Clinical Social Worker

## 2017-04-02 DIAGNOSIS — F411 Generalized anxiety disorder: Secondary | ICD-10-CM | POA: Diagnosis not present

## 2017-04-02 DIAGNOSIS — F4321 Adjustment disorder with depressed mood: Secondary | ICD-10-CM

## 2017-04-02 DIAGNOSIS — Z634 Disappearance and death of family member: Secondary | ICD-10-CM | POA: Diagnosis not present

## 2017-04-02 NOTE — Progress Notes (Signed)
   THERAPIST PROGRESS NOTE  Session Time: 2:10-3pm  Participation Level: Active  Behavioral Response: CasualAlert/Sad  Type of Therapy: Individual Therapy  Treatment Goals addressed: Coping  Interventions: CBT/DBT  Summary: Sarah PaganRebecca Wagner is a 57 y.o. female who presents for individual counseling appointment. Pt discussed her psychiatric symptoms and current life events. Pt saw her psychiatrist Dr. Jannifer FranklinAkintayo and he did not change any of her meds. Pt continues to deal with her grief on a daily basis. She has moved to the angry stage again. Asked open ended questions and used empathic reflection. Pt is reading, "it's ok that you're not ok." Processed this with pt: how society judges where we should be in our grief." Asked open ended questions. Pt still struggles with blaming herself for her daughter's death. Taught pt, "check the facts."  Gave pt handout. Still encouraging pt to use her self soothing to assist with her anxiety.    Suicidal/Homicidal: Nowithout intent/plan  Therapist Response: Assessed pt's current functioning. Assisted pt processing grief,  grief recovery, anger, Check the facts."   Plan: Return again in 1 week.  Diagnosis: Axis I: Grief at the loss of a child, Anxiety    Kista Robb S, LCAS 04/02/2017

## 2017-04-07 ENCOUNTER — Encounter (HOSPITAL_COMMUNITY): Payer: Self-pay | Admitting: Licensed Clinical Social Worker

## 2017-04-07 ENCOUNTER — Ambulatory Visit (INDEPENDENT_AMBULATORY_CARE_PROVIDER_SITE_OTHER): Payer: 59 | Admitting: Licensed Clinical Social Worker

## 2017-04-07 DIAGNOSIS — Z634 Disappearance and death of family member: Secondary | ICD-10-CM | POA: Diagnosis not present

## 2017-04-07 DIAGNOSIS — F4321 Adjustment disorder with depressed mood: Secondary | ICD-10-CM | POA: Diagnosis not present

## 2017-04-07 DIAGNOSIS — F411 Generalized anxiety disorder: Secondary | ICD-10-CM

## 2017-04-07 NOTE — Progress Notes (Signed)
   THERAPIST PROGRESS NOTE  Session Time: 2:10-3pm  Participation Level: Active  Behavioral Response: CasualAlert/Sad  Type of Therapy: Individual Therapy  Treatment Goals addressed: Coping  Interventions: CBT/DBT  Summary: Sarah Wagner is a [age] y.o. female who presents for individual counseling appointment. Pt discussed her psychiatric symptoms and current life events. Pt presents tearful today. She wanted to talk about her identity. "I don't know who I am anymore." Asked open ended questions and used empathic reflection. Pt feels she has lost her identity as a mother since her daughter has died. Validated pt's feelings. Used MI and CBT to help build and support rational supportive thinking.        Suicidal/Homicidal: Nowithout intent/plan  Therapist Response: Assessed pt's current functioning. Assisted pt processing grief,  grief recovery, anger, Check the facts."   Plan: Return again in 1 week.  Diagnosis: Axis I: Grief at the loss of a child, Anxiety    MACKENZIE,LISBETH S, LCAS 04/07/2017

## 2017-04-14 ENCOUNTER — Ambulatory Visit (HOSPITAL_COMMUNITY): Payer: Self-pay | Admitting: Licensed Clinical Social Worker

## 2017-04-21 ENCOUNTER — Ambulatory Visit (INDEPENDENT_AMBULATORY_CARE_PROVIDER_SITE_OTHER): Payer: 59 | Admitting: Licensed Clinical Social Worker

## 2017-04-21 ENCOUNTER — Encounter (HOSPITAL_COMMUNITY): Payer: Self-pay | Admitting: Licensed Clinical Social Worker

## 2017-04-21 DIAGNOSIS — Z634 Disappearance and death of family member: Secondary | ICD-10-CM | POA: Diagnosis not present

## 2017-04-21 DIAGNOSIS — F4321 Adjustment disorder with depressed mood: Secondary | ICD-10-CM | POA: Diagnosis not present

## 2017-04-21 DIAGNOSIS — F411 Generalized anxiety disorder: Secondary | ICD-10-CM

## 2017-04-21 NOTE — Progress Notes (Signed)
   THERAPIST PROGRESS NOTE  Session Time: 2:10-3pm  Participation Level: Active  Behavioral Response: CasualAlert/Sad  Type of Therapy: Individual Therapy  Treatment Goals addressed: Coping  Interventions: CBT/DBT  Summary: Sarah PaganRebecca Wagner is a 57 y.o. female who presents for individual counseling appointment. Pt discussed her psychiatric symptoms and current life events. Pt presented more upbeat today but became more tearful as the session continues. Pt continues to be confused about her identity. "I don't know who I am anymore." Asked open ended questions and used empathic reflection. Pt talked about her sleep today. She is struggling to sleep through the night. They have bought a new mattress so hoping this cures the problem. Pt talked about her grief, she thought it would get easier as time continues. Pt asked: "I kept wishing all the pain of my daughter would end, does that mean I was wishing she would die." Processed this with pt but gave her this question as a homework assignment that can be discussed at the next counseling session.      Suicidal/Homicidal: Nowithout intent/plan  Therapist Response: Assessed pt's current functioning. Assisted pt processing grief,  Identity, sleep patterns, pain of losing her daughter. Assisted pt processing for the management of her stressors.   Plan: Return again in 1 week.  Diagnosis: Axis I: Grief at the loss of a child, Anxiety    Kyson Kupper S, LCAS 04/21/2017

## 2017-05-05 ENCOUNTER — Ambulatory Visit (INDEPENDENT_AMBULATORY_CARE_PROVIDER_SITE_OTHER): Payer: 59 | Admitting: Licensed Clinical Social Worker

## 2017-05-05 ENCOUNTER — Encounter (HOSPITAL_COMMUNITY): Payer: Self-pay | Admitting: Licensed Clinical Social Worker

## 2017-05-05 DIAGNOSIS — Z634 Disappearance and death of family member: Secondary | ICD-10-CM

## 2017-05-05 DIAGNOSIS — F4321 Adjustment disorder with depressed mood: Secondary | ICD-10-CM | POA: Diagnosis not present

## 2017-05-05 DIAGNOSIS — F411 Generalized anxiety disorder: Secondary | ICD-10-CM | POA: Diagnosis not present

## 2017-05-05 NOTE — Progress Notes (Signed)
   THERAPIST PROGRESS NOTE  Session Time: 2:10-3pm  Participation Level: Active  Behavioral Response: CasualAlert/Sad  Type of Therapy: Individual Therapy  Treatment Goals addressed: Coping  Interventions: CBT/DBT  Summary: Sarah PaganRebecca Daniello is a 57 y.o. female who presents for individual counseling appointment. Pt discussed her psychiatric symptoms and current life events. Pt presented more upbeat today but became more tearful as the session continued. Pt wrote in her journal, "Was I wishing my daughter would die?" Pt had some thoughtful insight. Asked pt open ended questions and used empathic reflection. Pt became tearful during this discussion. Discussed addiction with pt and "hitting bottom." Homework: think about what she wants for her next phase of her life.         Suicidal/Homicidal: Nowithout intent/plan  Therapist Response: Assessed pt's current functioning. Assisted pt processing grief, addiction, pain of losing her daughter. Assisted pt processing for the management of her stressors.   Plan: Return again in 1 week.  Diagnosis: Axis I: Grief at the loss of a child, Anxiety    MACKENZIE,LISBETH S, LCAS 05/05/2017

## 2017-05-12 ENCOUNTER — Encounter (HOSPITAL_COMMUNITY): Payer: Self-pay | Admitting: Licensed Clinical Social Worker

## 2017-05-12 ENCOUNTER — Ambulatory Visit (INDEPENDENT_AMBULATORY_CARE_PROVIDER_SITE_OTHER): Payer: 59 | Admitting: Licensed Clinical Social Worker

## 2017-05-12 DIAGNOSIS — F4321 Adjustment disorder with depressed mood: Secondary | ICD-10-CM | POA: Diagnosis not present

## 2017-05-12 DIAGNOSIS — F411 Generalized anxiety disorder: Secondary | ICD-10-CM | POA: Diagnosis not present

## 2017-05-12 DIAGNOSIS — Z634 Disappearance and death of family member: Secondary | ICD-10-CM | POA: Diagnosis not present

## 2017-05-12 NOTE — Progress Notes (Signed)
   THERAPIST PROGRESS NOTE  Session Time: 2:10-3pm  Participation Level: Active  Behavioral Response: CasualAlert/Sad  Type of Therapy: Individual Therapy  Treatment Goals addressed: Coping  Interventions: CBT/DBT  Summary: Sarah Wagner is a 57 y.o. female who presents for individual counseling appointment. Pt discussed her psychiatric symptoms and current life events. Pt presents sad today. for her individual session. Of course she still continues to grieve her daughter. She was off of school last week and it was hard for her with all the free time. Discussed how she can keep herself busy this summer. Discussed her future, what she wants it to look like and make plans. Asked open ended questions. Pt shares she is concerned she may go back in that dark hole she was in prior to coming to [facility]. Gave pt handout on negative thoughts. She completed the handout and we discussed it.             Suicidal/Homicidal: Nowithout intent/plan  Therapist Response: Assessed pt's current functioning. Assisted pt processing grief, summer plans, staying busy, pain of losing her daughter. Assisted pt processing for the management of her stressors.   Plan: Return again in 1 week.  Diagnosis: Axis I: Grief at the loss of a child, Anxiety    Valley Ke S, LCAS 05/12/2017

## 2017-05-22 ENCOUNTER — Ambulatory Visit (HOSPITAL_COMMUNITY): Payer: Self-pay | Admitting: Licensed Clinical Social Worker

## 2017-05-28 ENCOUNTER — Ambulatory Visit (HOSPITAL_COMMUNITY): Payer: Self-pay | Admitting: Licensed Clinical Social Worker

## 2017-06-04 ENCOUNTER — Ambulatory Visit (INDEPENDENT_AMBULATORY_CARE_PROVIDER_SITE_OTHER): Payer: 59 | Admitting: Licensed Clinical Social Worker

## 2017-06-04 ENCOUNTER — Encounter (HOSPITAL_COMMUNITY): Payer: Self-pay | Admitting: Licensed Clinical Social Worker

## 2017-06-04 DIAGNOSIS — F4321 Adjustment disorder with depressed mood: Secondary | ICD-10-CM | POA: Diagnosis not present

## 2017-06-04 DIAGNOSIS — Z634 Disappearance and death of family member: Secondary | ICD-10-CM

## 2017-06-04 DIAGNOSIS — F331 Major depressive disorder, recurrent, moderate: Secondary | ICD-10-CM

## 2017-06-04 NOTE — Progress Notes (Signed)
   THERAPIST PROGRESS NOTE  Session Time: 3:10-4pm  Participation Level: Active  Behavioral Response: CasualAlert/Tearful  Type of Therapy: Individual Therapy  Treatment Goals addressed: Coping  Interventions: CBT/DBT  Summary: Sarah Wagner is a 57 y.o. female who presents for individual counseling appointment. Pt discussed her psychiatric symptoms and current life events.   Completed new tx plan with pt and assisted her identifying goals for continued tx. Pt completed the PHQ9 and reviewed results. Pt and her husband attended their first hospice group. Asked open ended questions and used empathic reflection. Pt was tearful in her description of the group. 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 Discussed summer plans and what she is doing to pre plan her summer. Pt still continues to grieve her daughter. Asked pt open ended questions. Pt still maintains a good relationship with her son.        Suicidal/Homicidal: Nowithout intent/plan  Therapist Response: Assessed pt'Wagner current functioning. Assisted pt processing grief, summer plans, staying busy, hospice group, relationship with son, tx plan, continued grief. Assisted pt processing for the management of her stressors.   Plan: Return again in 1 week.  Diagnosis: Axis I: Depression, Grief at the loss of a child    Sarah Wagner, LCAS 06/04/2017

## 2017-06-17 ENCOUNTER — Ambulatory Visit (HOSPITAL_COMMUNITY): Payer: Self-pay | Admitting: Licensed Clinical Social Worker

## 2017-06-24 ENCOUNTER — Encounter (HOSPITAL_COMMUNITY): Payer: Self-pay | Admitting: Licensed Clinical Social Worker

## 2017-06-24 ENCOUNTER — Ambulatory Visit (INDEPENDENT_AMBULATORY_CARE_PROVIDER_SITE_OTHER): Payer: 59 | Admitting: Licensed Clinical Social Worker

## 2017-06-24 DIAGNOSIS — F411 Generalized anxiety disorder: Secondary | ICD-10-CM | POA: Diagnosis not present

## 2017-06-24 NOTE — Progress Notes (Signed)
Comprehensive Clinical Assessment (CCA) Note  06/24/2017 Sarah Wagner [id number]  Visit Diagnosis:      ICD-10-CM   1. Generalized anxiety disorder F41.1       CCA Part One  Part One has been completed on paper by the patient.  (See scanned document in Chart Review)  CCA Part Two A  Intake/Chief Complaint:  CCA Intake With Chief Complaint CCA Part Two Date: 06/24/17 CCA Part Two Time: 1038 Chief Complaint/Presenting Problem: Pt has returned to therapy since the death of her daughter. pt is grieving, anxiety, and depressive symptoms Patients Currently Reported Symptoms/Problems: Sadness, tearful, anxious, problems getting to sleep, grief, depression denies SI HI or A/V hallucinatiions. Collateral Involvement: Husband and a few friends are very supportive. Individual's Strengths: Pt is very motivated for treatment. Individual's Preferences: Prefers to feel less anxious, less depressed Individual's Abilities: ability to work through her grief, sadness, anxiety and depression Type of Services Patient Feels Are Needed: outpatient  Mental Health Symptoms Depression:  Depression: Tearfulness  Mania:  Mania: N/A  Anxiety:   Anxiety: Restlessness, Worrying  Psychosis:  Psychosis: N/A  Trauma:  Trauma: N/A  Obsessions:  Obsessions: N/A  Compulsions:  Compulsions: Disrupts with routine/functioning, "Driven" to perform behaviors/acts, Intrusive/time consuming  Inattention:  Inattention: N/A  Hyperactivity/Impulsivity:  Hyperactivity/Impulsivity: N/A  Oppositional/Defiant Behaviors:  Oppositional/Defiant Behaviors: N/A  Borderline Personality:  Emotional Irregularity: N/A  Other Mood/Personality Symptoms:      Mental Status Exam Appearance and self-care  Stature:  Stature: Average  Weight:  Weight: Average weight  Clothing:  Clothing: Casual  Grooming:  Grooming: Normal  Cosmetic use:  Cosmetic Use: None  Posture/gait:  Posture/Gait: Normal  Motor activity:  Motor Activity: Not  Remarkable  Sensorium  Attention:  Attention: Normal  Concentration:  Concentration: Preoccupied  Orientation:  Orientation: X5  Recall/memory:  Recall/Memory: Normal  Affect and Mood  Affect:  Affect: Appropriate  Mood:  Mood: (sad)  Relating  Eye contact:  Eye Contact: Normal  Facial expression:  Facial Expression: Anxious  Attitude toward examiner:  Attitude Toward Examiner: Cooperative  Thought and Language  Speech flow: Speech Flow: Normal  Thought content:  Thought Content: Appropriate to mood and circumstances  Preoccupation:  Preoccupations: Ruminations  Hallucinations:     Organization:     Company secretary of Knowledge:  Fund of Knowledge: Average  Intelligence:  Intelligence: [employer]  Abstraction:  Abstraction: Normal  Judgement:  Judgement: Normal  Reality Testing:  Reality Testing: Realistic  Insight:  Insight: Good  Decision Making:  Decision Making: Normal  Social Functioning  Social Maturity:  Social Maturity: Responsible  Social Judgement:  Social Judgement: Normal  Stress  Stressors:  Stressors: Veterinary surgeon, Transitions  Coping Ability:  Coping Ability: Horticulturist, commercial Deficits:     Supports:      Family and Psychosocial History: Family history Marital status: Married Number of Years Married: 33 What types of issues is patient dealing with in the relationship?: Pt's husband is very supportive.  Pt reports no issues within her marriage, however, they are both grieving the loss of their daughter to drugs Does patient have children?: Yes How many children?: 1 How is patient's relationship with their children?: son is 17 and lives in [place]. to graduate in december  Childhood History:  Childhood History By whom was/is the patient raised?: Both parents Additional childhood history information: Pt was born in [place], [place].  Reports having a "good" childhood.  Denies any trauma or abuse. Description of patient's relationship with  caregiver when they were a child: mother was anxious, i was a daddy's girl Patient's description of current relationship with people who raised him/her: both deceased How were you disciplined when you got in trouble as a child/adolescent?: guilt Does patient have siblings?: Yes Number of Siblings: 1 Description of patient's current relationship with siblings: One older sister who resides in [place].  Pt states they are close. Did patient suffer any verbal/emotional/physical/sexual abuse as a child?: No Did patient suffer from severe childhood neglect?: No Has patient ever been sexually abused/assaulted/raped as an adolescent or adult?: No Was the patient ever a victim of a crime or a disaster?: No Witnessed domestic violence?: No Has patient been effected by domestic violence as an adult?: No  CCA Part Two B  Employment/Work Situation: Employment / Work Situation Employment situation: Employed Where is patient currently employed?: 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?: No Are There Guns or Other Weapons in Your Home?: No  Education: Education Did Garment/textile technologist From [employer]?: Yes Did Theme park manager?: Yes What Type of College Degree Do you Have?: BA Did You Attend Graduate School?: No What Was Your Major?: Psychology Did You Have Any Special Interests In School?: Art Did You Have An Individualized Education Program (IIEP): No Did You Have Any Difficulty At [employer]?: No  Religion: Religion/Spirituality Are You A Religious Person?: Yes What is Your Religious Affiliation?:  Catholic  Leisure/Recreation: Leisure / Recreation Leisure and Hobbies: birdwatching, baking, reading, painting, drawing  Exercise/Diet: Exercise/Diet Do You Exercise?: No Have You Gained or Lost A Significant Amount of Weight in the Past Six Months?: No Do You Follow a Special Diet?: No Do You Have Any Trouble Sleeping?: Yes Explanation of Sleeping Difficulties: Restless sleep  CCA Part Two C  Alcohol/Drug Use: Alcohol / Drug Use History of alcohol / drug use?: No history of alcohol / drug abuse                      CCA Part Three  ASAM's:  Six Dimensions of Multidimensional Assessment  Dimension 1:  Acute Intoxication and/or Withdrawal Potential:     Dimension 2:  Biomedical Conditions and Complications:     Dimension 3:  Emotional, Behavioral, or Cognitive Conditions and Complications:     Dimension 4:  Readiness to Change:     Dimension 5:  Relapse, Continued use, or Continued Problem Potential:     Dimension 6:  Recovery/Living Environment:      Substance use Disorder (SUD)    Social Function:  Social Functioning Social Maturity: Responsible Social Judgement: Normal  Stress:  Stress Stressors: Grief/losses, Transitions Coping Ability: Exhausted Patient Takes Medications The Way The Doctor Instructed?: Yes Priority Risk: Low Acuity  Risk Assessment- Self-Harm Potential: Risk Assessment For Self-Harm Potential Thoughts of Self-Harm: No current thoughts Method: No plan Availability of Means: No access/NA  Risk Assessment -Dangerous to Others Potential: Risk Assessment For Dangerous to Others Potential Method: No Plan Availability of Means: No access or NA Intent: Vague intent or NA Notification Required: No need or identified person  DSM5 Diagnoses: Patient Active Problem List   Diagnosis  Date Noted  . Grief at loss of child 02/06/2017  . Depression, major, recurrent, moderate (HCC) 06/08/2015    Class: Chronic  . Hypothyroidism 04/07/2007   . Dyslipidemia 04/07/2007  . Adjustment disorder with mixed anxiety and depressed mood 04/07/2007  . Allergic rhinitis 04/07/2007  . HEADACHE 04/07/2007    Patient Centered Plan: Patient is on the following Treatment Plan(s):  Depression and anxiety, grief  Recommendations for Services/Supports/Treatments: Recommendations for Services/Supports/Treatments Recommendations For Services/Supports/Treatments: Individual Therapy  Treatment Plan Summary:    Referrals to Alternative Service(s): Referred to Alternative Service(s):   Place:   Date:   Time:    Referred to Alternative Service(s):   Place:   Date:   Time:    Referred to Alternative Service(s):   Place:   Date:   Time:    Referred to Alternative Service(s):   Place:   Date:   Time:     Vernona RiegerMACKENZIE,Lynn Sissel S

## 2017-07-01 ENCOUNTER — Ambulatory Visit (INDEPENDENT_AMBULATORY_CARE_PROVIDER_SITE_OTHER): Payer: 59 | Admitting: Licensed Clinical Social Worker

## 2017-07-01 ENCOUNTER — Encounter (HOSPITAL_COMMUNITY): Payer: Self-pay | Admitting: Licensed Clinical Social Worker

## 2017-07-01 DIAGNOSIS — Z634 Disappearance and death of family member: Secondary | ICD-10-CM | POA: Diagnosis not present

## 2017-07-01 DIAGNOSIS — F4321 Adjustment disorder with depressed mood: Secondary | ICD-10-CM | POA: Diagnosis not present

## 2017-07-01 DIAGNOSIS — F331 Major depressive disorder, recurrent, moderate: Secondary | ICD-10-CM | POA: Diagnosis not present

## 2017-07-01 NOTE — Progress Notes (Signed)
   THERAPIST PROGRESS NOTE  Session Time: 11:10-12 pm  Participation Level: Active  Behavioral Response: CasualAlert/Tearful  Type of Therapy: Individual Therapy  Treatment Goals addressed: Coping  Interventions: CBT/Supportive  Summary: Sarah PaganRebecca Wagner is a 57 y.o. female who presents for individual counseling appointment. Pt discussed her psychiatric symptoms and current life events.  Pt reports she saw her psychiatrist and he did not change any of her medications. Pt had previously reported she was having some OCD tendencies, but he didn't feel the need to change her meds. Pt [facility] Her husband had previously gone and I suggested she attend also. Pt reports it was hard but she was able to accept help from another mother in the group and give assistance to another parent in the group. The pt was tearful in talking about her daughter and her feelings surrounding her death. Asked open ended questions and used empathic reflection. Still suggest to pt to journal her feelings because her daughter's birthday is next week. Discussed "armoring herself" for the day, surrounding herself with positive and caring people.   Suicidal/Homicidal: Nowithout intent/plan  Therapist Response: Assessed pt's current functioning. Assisted pt processing grief, hospice group, daughter's birthday. Assisted pt processing for the management of her stressors.   Plan: Return again in 1 week.  Diagnosis: Axis I: Depression, Grief at the loss of a child    Astella Desir S, LCAS 07/01/2017

## 2017-07-07 ENCOUNTER — Ambulatory Visit (INDEPENDENT_AMBULATORY_CARE_PROVIDER_SITE_OTHER): Payer: Managed Care, Other (non HMO) | Admitting: Internal Medicine

## 2017-07-07 ENCOUNTER — Encounter: Payer: Self-pay | Admitting: Internal Medicine

## 2017-07-07 ENCOUNTER — Other Ambulatory Visit: Payer: Self-pay | Admitting: Internal Medicine

## 2017-07-07 VITALS — BP 104/60 | Temp 98.4°F | Wt 155.0 lb

## 2017-07-07 DIAGNOSIS — E039 Hypothyroidism, unspecified: Secondary | ICD-10-CM

## 2017-07-07 DIAGNOSIS — E785 Hyperlipidemia, unspecified: Secondary | ICD-10-CM

## 2017-07-07 DIAGNOSIS — G4733 Obstructive sleep apnea (adult) (pediatric): Secondary | ICD-10-CM | POA: Diagnosis not present

## 2017-07-07 DIAGNOSIS — F331 Major depressive disorder, recurrent, moderate: Secondary | ICD-10-CM | POA: Diagnosis not present

## 2017-07-07 LAB — COMPREHENSIVE METABOLIC PANEL
ALT: 9 U/L (ref 0–35)
AST: 11 U/L (ref 0–37)
Albumin: 4.5 g/dL (ref 3.5–5.2)
Alkaline Phosphatase: 74 U/L (ref 39–117)
BILIRUBIN TOTAL: 0.3 mg/dL (ref 0.2–1.2)
BUN: 11 mg/dL (ref 6–23)
CALCIUM: 9.9 mg/dL (ref 8.4–10.5)
CO2: 29 meq/L (ref 19–32)
CREATININE: 0.89 mg/dL (ref 0.40–1.20)
Chloride: 104 mEq/L (ref 96–112)
GFR: 69.14 mL/min (ref >60.00)
GLUCOSE: 108 mg/dL — AB (ref 70–99)
Potassium: 4.5 mEq/L (ref 3.5–5.1)
Sodium: 141 mEq/L (ref 135–145)
Total Protein: 7.3 g/dL (ref 6.0–8.3)

## 2017-07-07 LAB — TSH: TSH: 0.7 u[IU]/mL (ref 0.35–4.50)

## 2017-07-07 LAB — CBC WITH DIFFERENTIAL/PLATELET
BASOS ABS: 0 10*3/uL (ref 0.0–0.1)
Basophils Relative: 0.6 % (ref 0.0–3.0)
EOS ABS: 0.2 10*3/uL (ref 0.0–0.7)
Eosinophils Relative: 2 % (ref 0.0–5.0)
HCT: 41.6 % (ref 36.0–46.0)
Hemoglobin: 13.9 g/dL (ref 12.0–15.0)
LYMPHS ABS: 2.1 10*3/uL (ref 0.7–4.0)
Lymphocytes Relative: 27 % (ref 12.0–46.0)
MCHC: 33.4 g/dL (ref 30.0–36.0)
MCV: 85.7 fl (ref 78.0–100.0)
MONO ABS: 0.5 10*3/uL (ref 0.1–1.0)
Monocytes Relative: 6.7 % (ref 3.0–12.0)
NEUTROS ABS: 5 10*3/uL (ref 1.4–7.7)
NEUTROS PCT: 63.7 % (ref 43.0–77.0)
PLATELETS: 330 10*3/uL (ref 150.0–400.0)
RBC: 4.86 Mil/uL (ref 3.87–5.11)
RDW: 13.6 % (ref 11.5–15.5)
WBC: 7.9 10*3/uL (ref 4.0–10.5)

## 2017-07-07 LAB — LIPID PANEL
CHOL/HDL RATIO: 4
Cholesterol: 191 mg/dL (ref 0–200)
HDL: 46.8 mg/dL (ref >39.00)
LDL Cholesterol: 104 mg/dL — ABNORMAL HIGH (ref 0–99)
NONHDL: 144
Triglycerides: 198 mg/dL — ABNORMAL HIGH (ref 0.0–149.0)
VLDL: 39.6 mg/dL (ref 0.0–40.0)

## 2017-07-07 MED ORDER — SIMVASTATIN 20 MG PO TABS: ORAL_TABLET | ORAL | 4 refills | Status: DC

## 2017-07-07 MED ORDER — LEVOTHYROXINE SODIUM 75 MCG PO TABS: 75.0000 ug | ORAL_TABLET | Freq: Every day | ORAL | 3 refills | Status: DC

## 2017-07-07 NOTE — Progress Notes (Signed)
Subjective:    Patient ID: Sarah Wagner, female    DOB: 1960-08-29, 57 y.o.   MRN: [id number]  HPI  57 year old patient who is seen today for annual follow-up.  She is followed closely by behavioral health following the death of her daughter with a history of substance abuse earlier this year She has a history of hypothyroidism and remains on supplemental levothyroxine.  She is on statin therapy for dyslipidemia.  She states that she has a prior history [place]  She states that she was treated temporarily with CPAP and later BiPAP which she did not tolerate well.  She complains of loud snoring but very little in the way of daytime sleepiness.    Medical regimen includes bupropion and citalopram.  She takes rare lorazepam.  She is interested in pursuing a diet and weight loss.  Past Medical History:  Diagnosis Date  . Allergy   . Anxiety   . Depression   . Headache(784.0)   . Hyperlipidemia   . Thyroid disease      Social History   Socioeconomic History  . Marital status: Married    Spouse name: Not on file  . Number of children: Not on file  . Years of education: Not on file  . Highest education level: Not on file  Occupational History  . Not on file  Social Needs  . Financial resource strain: Not on file  . Food insecurity:    Worry: Not on file    Inability: Not on file  . Transportation needs:    Medical: Not on file    Non-medical: Not on file  Tobacco Use  . Smoking status: Never Smoker  . Smokeless tobacco: Never Used  Substance and Sexual Activity  . Alcohol use: No  . Drug use: No  . Sexual activity: Not Currently  Lifestyle  . Physical activity:    Days per week: Not on file    Minutes per session: Not on file  . Stress: Not on file  Relationships  . Social connections:    Talks on phone: Not on file    Gets together: Not on file    Attends religious service: Not on file    Active member of club or organization:  Not on file    Attends meetings of clubs or organizations: Not on file    Relationship status: Not on file  . Intimate partner violence:    Fear of current or ex partner: Not on file    Emotionally abused: Not on file    Physically abused: Not on file    Forced sexual activity: Not on file  Other Topics Concern  . Not on file  Social History Narrative  . Not on file    Past Surgical History:  Procedure Laterality Date  . ABDOMINAL HYSTERECTOMY    . HERNIA REPAIR      Family History  Problem Relation Age of Onset  . Cancer Mother        ovarian  . Parkinsonism Mother   . Depression Mother   . Anxiety disorder Mother   . Cancer Father        brain  . Diabetes Father   . Crohn's disease Father   . Graves' disease Father   . Hyperlipidemia Father   . Depression Father   . Anxiety disorder Father   . Crohn's disease Sister     Allergies  Allergen Reactions  . Sulfacetamide Sodium  Current Outpatient Medications on File Prior to Visit  Medication Sig Dispense Refill  . buPROPion (WELLBUTRIN XL) 150 MG 24 hr tablet TAKE 1 DAILY FOR DEPRESSION  2  . LORazepam (ATIVAN) 0.5 MG tablet Take 0.5 mg by mouth as needed for anxiety.    Marland Kitchen oxymetazoline (AFRIN) 0.05 % nasal spray Place 1 spray into both nostrils 2 (two) times daily as needed for congestion.    Marland Kitchen tretinoin (RETIN-A) 0.05 % cream Apply topically at bedtime. 45 g 4  . XIIDRA 5 % SOLN PLACE 1 DROP INTO BOTH EYES TWICE DAILY  4  . citalopram (CELEXA) 40 MG tablet Take 1 tablet (40 mg total) by mouth daily. 30 tablet 2   No current facility-administered medications on file prior to visit.     BP 104/60 (BP Location: Right Arm, Patient Position: Sitting, Cuff Size: Large)   Temp 98.4 F (36.9 C) (Oral)   Wt 155 lb (70.3 kg)   BMI 30.27 kg/m     Review of Systems  Constitutional: Positive for unexpected weight change.  HENT: Negative for congestion, dental problem, hearing loss, rhinorrhea, sinus pressure,  sore throat and tinnitus.   Eyes: Negative for pain, discharge and visual disturbance.  Respiratory: Negative for cough and shortness of breath.   Cardiovascular: Negative for chest pain, palpitations and leg swelling.  Gastrointestinal: Negative for abdominal distention, abdominal pain, blood in stool, constipation, diarrhea, nausea and vomiting.  Genitourinary: Negative for difficulty urinating, dysuria, flank pain, frequency, hematuria, pelvic pain, urgency, vaginal bleeding, vaginal discharge and vaginal pain.  Musculoskeletal: Negative for arthralgias, gait problem and joint swelling.  Skin: Negative for rash.  Neurological: Negative for dizziness, syncope, speech difficulty, weakness, numbness and headaches.  Hematological: Negative for adenopathy.  Psychiatric/Behavioral: Positive for dysphoric mood. Negative for agitation and behavioral problems. The patient is nervous/anxious.        Objective:   Physical Exam  Constitutional: She is oriented to person, place, and time. She appears well-developed and well-nourished.  Weight 155 blood pressure well controlled   HENT:  Head: Normocephalic.  Right Ear: External ear normal.  Left Ear: External ear normal.  Mouth/Throat: Oropharynx is clear and moist.  Eyes: Pupils are equal, round, and reactive to light. Conjunctivae and EOM are normal.  Neck: Normal range of motion. Neck supple. No thyromegaly present.  Cardiovascular: Normal rate, regular rhythm, normal heart sounds and intact distal pulses.  Pulmonary/Chest: Effort normal and breath sounds normal.  Abdominal: Soft. Bowel sounds are normal. She exhibits no mass. There is no tenderness.  Musculoskeletal: Normal range of motion.  Lymphadenopathy:    She has no cervical adenopathy.  Neurological: She is alert and oriented to person, place, and time.  Skin: Skin is warm and dry. No rash noted.  Psychiatric: She has a normal mood and affect. Her behavior is normal.            Assessment & Plan:   Hypothyroidism.  Continue levothyroxine.  Will check a TSH Dyslipidemia.  Continue simvastatin will review a lipid profile Grief reaction.  Follow-up behavioral health History of OSA.  Patient states that loud snoring is quite disruptive for family and friends on trips.  We will set up for pulmonary evaluation  Gordy Savers

## 2017-07-07 NOTE — Patient Instructions (Addendum)
Follow-up behavioral health Pulmonary consultation as discussed     It is important that you exercise regularly, at least 20 minutes 3 to 4 times per week.  If you develop chest pain or shortness of breath seek  medical attention.  Follow-up with primary care in 4 to 6 months

## 2017-07-08 ENCOUNTER — Ambulatory Visit (INDEPENDENT_AMBULATORY_CARE_PROVIDER_SITE_OTHER): Payer: 59 | Admitting: Licensed Clinical Social Worker

## 2017-07-08 DIAGNOSIS — Z634 Disappearance and death of family member: Secondary | ICD-10-CM

## 2017-07-08 DIAGNOSIS — F4321 Adjustment disorder with depressed mood: Secondary | ICD-10-CM | POA: Diagnosis not present

## 2017-07-08 DIAGNOSIS — F411 Generalized anxiety disorder: Secondary | ICD-10-CM | POA: Diagnosis not present

## 2017-07-09 NOTE — Telephone Encounter (Signed)
DENIED.  FILLED ON 07/07/2017 FOR 1 YEAR.  MESSAGE SENT TO THE PHARMACY.

## 2017-07-10 ENCOUNTER — Encounter (HOSPITAL_COMMUNITY): Payer: Self-pay | Admitting: Licensed Clinical Social Worker

## 2017-07-10 NOTE — Progress Notes (Signed)
   THERAPIST PROGRESS NOTE  Session Time: 10:10-11am  Participation Level: Active  Behavioral Response: CasualAlert/Tearful  Type of Therapy: Individual Therapy  Treatment Goals addressed: Coping  Interventions: CBT/Supportive  Summary: Sarah Wagner is a 57 y.o. female who presents for individual counseling appointment. Pt discussed her psychiatric symptoms and current life events. Pt presents tearful today. Her daughter's birthday is tomorrow. Asked open ended questions and used empathic reflection. Pt talked at length about her daughter, missed opportunities, childhood. Pt cried openly and expressed her sadness. Pt reports she had a lot of anxiety and panic on Sunday. Discussed the reasons. Discussed the importance of self care especially now. Pt has been journaling more. She brought in her journal and read from it. Pt is expressing her pain. Validated pt on her feelings and suggested she continue to journal.    Suicidal/Homicidal: Nowithout intent/plan  Therapist Response: Assessed pt's current functioning. Assisted pt processing grief, journaling, self care, , daughter's birthday. Assisted pt processing for the management of her stressors.   Plan: Return again in 1 week.  Diagnosis: Axis I: Anxiety, Grief at the loss of a child    Jessey Huyett S, LCAS 07/08/17

## 2017-07-22 ENCOUNTER — Ambulatory Visit (HOSPITAL_COMMUNITY): Payer: Self-pay | Admitting: Licensed Clinical Social Worker

## 2017-07-29 ENCOUNTER — Ambulatory Visit (HOSPITAL_COMMUNITY): Payer: Self-pay | Admitting: Licensed Clinical Social Worker

## 2017-08-05 ENCOUNTER — Encounter (HOSPITAL_COMMUNITY): Payer: Self-pay | Admitting: Licensed Clinical Social Worker

## 2017-08-05 ENCOUNTER — Ambulatory Visit (INDEPENDENT_AMBULATORY_CARE_PROVIDER_SITE_OTHER): Payer: 59 | Admitting: Licensed Clinical Social Worker

## 2017-08-05 DIAGNOSIS — F411 Generalized anxiety disorder: Secondary | ICD-10-CM

## 2017-08-05 DIAGNOSIS — Z634 Disappearance and death of family member: Secondary | ICD-10-CM | POA: Diagnosis not present

## 2017-08-05 DIAGNOSIS — F4321 Adjustment disorder with depressed mood: Secondary | ICD-10-CM | POA: Diagnosis not present

## 2017-08-05 NOTE — Progress Notes (Signed)
   THERAPIST PROGRESS NOTE  Session Time: 11:10-12pm  Participation Level: Active  Behavioral Response: CasualAlert/Euthymic  Type of Therapy: Individual Therapy  Treatment Goals addressed: Coping  Interventions: CBT/Supportive  Summary: Sarah PaganRebecca Wagner is a 57 y.o. female who presents for individual counseling appointment. Pt discussed her psychiatric symptoms and current life events. Pt presents tired today due to her 2 week trip [place] in [place] and [place] in [place]. Asked open ended questions about her self care while visiting. Discussed with pt how her grief manifested while visiting. Educated pt on the importance of self care especially during her time of grief, ability to ask for help when she needs it. Pt was not tearful during session which shows growth. Pt was able to speak some of her daughter. [place]. Asked open ended questions about her feelings while visiting family. Pt was open in discussions expressing her feelings.   Suicidal/Homicidal: Nowithout intent/plan  Therapist Response: Assessed pt's current functioning. Assisted pt processing grief, self care,2 week trip, sharing her feelings . Assisted pt processing for the management of her stressors.   Plan: Return again in 2 weeks.  Diagnosis: Axis I: Anxiety, Grief at the loss of a child    MACKENZIE,LISBETH S, LCAS 08/05/17

## 2017-08-12 ENCOUNTER — Encounter (HOSPITAL_COMMUNITY): Payer: Self-pay | Admitting: Licensed Clinical Social Worker

## 2017-08-12 ENCOUNTER — Ambulatory Visit (INDEPENDENT_AMBULATORY_CARE_PROVIDER_SITE_OTHER): Payer: 59 | Admitting: Licensed Clinical Social Worker

## 2017-08-12 DIAGNOSIS — F411 Generalized anxiety disorder: Secondary | ICD-10-CM | POA: Diagnosis not present

## 2017-08-12 DIAGNOSIS — F4321 Adjustment disorder with depressed mood: Secondary | ICD-10-CM

## 2017-08-12 DIAGNOSIS — Z634 Disappearance and death of family member: Secondary | ICD-10-CM | POA: Diagnosis not present

## 2017-08-12 NOTE — Progress Notes (Signed)
   THERAPIST PROGRESS NOTE  Session Time: 1:10-2pm  Participation Level: Active  Behavioral Response: CasualAlert/Euthymic  Type of Therapy: Individual Therapy  Treatment Goals addressed: Coping  Interventions: CBT/Supportive  Summary: Sarah PaganRebecca Wagner is a 57 y.o. female who presents for individual counseling appointment. Pt discussed her psychiatric symptoms and current life events. Pt wanted to discuss memories she is experiencing of her daughter and what she will never have. Discussed stages of grief. Pt is in the anger stage. She is angry at what will never be. Because of the loss of her daughter pt holds on tight to her son who is a Holiday representativesenior in college. Asked open ended questions to engage pt in discussion of the relationship with her son. As the session progressed pt because more positive and shared she is feelings more present now. Discussed mindfulness with pt. Pt still continues to feel guilty that she is moving forward in her life, withought her daughter. Pt was vulnerable in her discussion and sharing. Discussed guilt with pt. 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 Pt has done well in filling her time this summer with structure.   Suicidal/Homicidal: Nowithout intent/plan  Therapist Response: Assessed pt's current functioning. Assisted pt processing grief, structure, school starting back, memories, relationship with son, sharing her feelings . Assisted pt processing for the management of her stressors.   Plan: Return again in 2 weeks.  Diagnosis: Axis I: Anxiety, Grief at the loss of a child    MACKENZIE,LISBETH S, LCAS 08/12/17

## 2017-08-19 ENCOUNTER — Ambulatory Visit (INDEPENDENT_AMBULATORY_CARE_PROVIDER_SITE_OTHER): Payer: 59 | Admitting: Licensed Clinical Social Worker

## 2017-08-19 ENCOUNTER — Encounter (HOSPITAL_COMMUNITY): Payer: Self-pay | Admitting: Licensed Clinical Social Worker

## 2017-08-19 DIAGNOSIS — F411 Generalized anxiety disorder: Secondary | ICD-10-CM | POA: Diagnosis not present

## 2017-08-19 DIAGNOSIS — Z634 Disappearance and death of family member: Secondary | ICD-10-CM | POA: Diagnosis not present

## 2017-08-19 DIAGNOSIS — F4321 Adjustment disorder with depressed mood: Secondary | ICD-10-CM
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
## 2017-08-26 ENCOUNTER — Ambulatory Visit (INDEPENDENT_AMBULATORY_CARE_PROVIDER_SITE_OTHER): Payer: Managed Care, Other (non HMO) | Admitting: Pulmonary Disease

## 2017-08-26 ENCOUNTER — Encounter: Payer: Self-pay | Admitting: Pulmonary Disease

## 2017-08-26 VITALS — BP 122/80 | HR 86 | Ht 59.0 in | Wt 156.8 lb

## 2017-08-26 DIAGNOSIS — R0683 Snoring: Secondary | ICD-10-CM | POA: Diagnosis not present

## 2017-08-26 DIAGNOSIS — J3489 Other specified disorders of nose and nasal sinuses: Secondary | ICD-10-CM | POA: Diagnosis not present

## 2017-08-26 NOTE — Progress Notes (Signed)
   Subjective:    Patient ID: Sarah PaganRebecca Tarbell, female    DOB: 11-Mar-1960, 57 y.o.   MRN: [id number]  HPI    Review of Systems  Constitutional: Negative.   HENT: Negative.   Eyes: Negative.   Respiratory: Positive for apnea, shortness of breath and wheezing.   Cardiovascular: Negative.   Gastrointestinal: Negative.   Endocrine: Negative.   Genitourinary: Negative.   Musculoskeletal: Negative.   Skin: Negative.   Allergic/Immunologic: Negative.   Neurological: Negative.   Hematological: Negative.   Psychiatric/Behavioral: Positive for sleep disturbance.       Objective:   Physical Exam        Assessment & Plan:

## 2017-08-26 NOTE — Progress Notes (Signed)
[facility]  Chief Complaint  Patient presents with  . sleep consult    referred for snoring. patient has a hard time breathing out of her nose.     Constitutional: BP 122/80 (BP Location: Left Arm, Cuff Size: Normal)   Pulse 86   Ht 4\' 11"  (1.499 m)   Wt 156 lb 12.8 oz (71.1 kg)   SpO2 93%   BMI 31.67 kg/m   History of Present Illness: Sarah Wagner is a 57 y.o. female with snoring and trouble breathing through her nose.  She had a sleep study about 20 yrs ago in [place].  Found to have sleep apnea.  Tried on CPAP and Bipap.  Had trouble breathing against pressure.  Hasn't used CPAP/Bipap for years.  Snoring worse.  Sleeps in separate room.  Feels her throat close and wakes up hearing herself snore.  Has trouble sleeping on her back.  Can fall asleep when reading.  She goes to sleep at 11 pm.  She falls asleep in 10 minutes.  She wakes up 2 times to use the bathroom.  She gets out of bed at 730 am.  She feels tired in the morning.  She denies morning headache.  She does not use anything to help her fall sleep or stay awake.  She talks in her sleep.  She denies sleep walking, bruxism, or nightmares.  There is no history of restless legs.  She denies sleep hallucinations, sleep paralysis, or cataplexy.  The Epworth score is 7 out of 24.  She has trouble breathing out of her nose.  She feels her nose closes when she is breathing, and then has to breath through her mouth.   Comprehensive Respiratory Exam:  Appearance - well kempt  ENMT - nasal mucosa moist, turbinates clear, midline nasal septum, no dental lesions, no gingival bleeding, no oral exudates, no tonsillar hypertrophy, narrow nasal passages with alar collapse with inhalation, MP 3, enlarged tongue Neck - no masses, trachea midline, no thyromegaly, no elevation in JVP Respiratory - normal appearance of chest wall, normal respiratory effort w/o accessory muscle use, no dullness on  percussion, no wheezing or rales CV - s1s2 regular rate and rhythm, no murmurs, no peripheral edema, no varicosities, radial pulses symmetric GI - soft, non tender, no masses, no hepatosplenomegaly Lymph - no adenopathy noted in neck and axillary areas MSK - normal muscle strength and tone, normal gait Ext - no cyanosis, clubbing, or joint inflammation noted Skin - no rashes, lesions, or ulcers Neuro - oriented to person, place, and time Psych - normal mood and affect  Discussion:  She has snoring, sleep disruption, apnea, and daytime sleepiness.  She has history of anxiety and depression, and prior history of sleep apnea.  Assessment/Plan:  Snoring with excessive daytime sleepiness. - will need to arrange for a home sleep study  Obesity. - discussed how weight can impact sleep and risk for sleep disordered breathing - discussed options to assist with weight loss: combination of diet modification, cardiovascular and strength training exercises  Cardiovascular risk. - had an extensive discussion regarding the adverse health consequences related to untreated sleep disordered breathing - specifically discussed the risks for hypertension, coronary artery disease, cardiac dysrhythmias, cerebrovascular disease, and diabetes - lifestyle modification discussed  Safe driving practices. - discussed how sleep disruption can increase risk of accidents, particularly when driving - safe driving practices were discussed  Therapies for obstructive sleep apnea. - if the sleep study shows significant sleep  apnea, then various therapies for treatment were reviewed: CPAP, oral appliance, and surgical interventions  Nasal passage blockage with difficulty breathing through her nose. - will arrange for referral to ENT   Patient Instructions  Will arrange for referral to ENT  Will arrange for home sleep study  Will call to arrange for follow up after sleep study reviewed     Coralyn HellingVineet Marchetta Navratil,  MD [facility] 08/26/2017, 11:47 AM  Flow Sheet  Sleep tests:  Review of Systems: Constitutional: Negative.   HENT: Negative.   Eyes: Negative.   Respiratory: Positive for apnea, shortness of breath and wheezing.   Cardiovascular: Negative.   Gastrointestinal: Negative.   Endocrine: Negative.   Genitourinary: Negative.   Musculoskeletal: Negative.   Skin: Negative.   Allergic/Immunologic: Negative.   Neurological: Negative.   Hematological: Negative.   Psychiatric/Behavioral: Positive for sleep disturbance.   Past Medical History: She  has a past medical history of Allergy, Anxiety, Depression, Headache(784.0), Hyperlipidemia, and Thyroid disease.  Past Surgical History: She  has a past surgical history that includes Hernia repair and Abdominal hysterectomy.  Family History: Her family history includes Anxiety disorder in her father and mother; Cancer in her father and mother; Crohn's disease in her father and sister; Depression in her father and mother; Diabetes in her father; Luiz BlareGraves' disease in her father; Hyperlipidemia in her father; Parkinsonism in her mother.  Social History: She  reports that she has never smoked. She has never used smokeless tobacco. She reports that she does not drink alcohol or use drugs.  Medications: Allergies as of 08/26/2017      Reactions   Sulfacetamide Sodium       Medication List        Accurate as of 08/26/17 11:47 AM. Always use your most recent med list.          buPROPion 150 MG 24 hr tablet Commonly known as:  WELLBUTRIN XL TAKE 1 DAILY FOR DEPRESSION   citalopram 40 MG tablet Commonly known as:  CELEXA Take 1 tablet (40 mg total) by mouth daily.   levothyroxine 75 MCG tablet Commonly known as:  SYNTHROID, LEVOTHROID Take 1 tablet (75 mcg total) by mouth daily.   LORazepam 0.5 MG tablet Commonly known as:  ATIVAN Take 0.5 mg by mouth as needed for anxiety.   oxymetazoline 0.05 % nasal  spray Commonly known as:  AFRIN Place 1 spray into both nostrils 2 (two) times daily as needed for congestion.   simvastatin 20 MG tablet Commonly known as:  ZOCOR TAKE 1 TABLET (20 MG TOTAL) BY MOUTH AT BEDTIME.   tretinoin 0.05 % cream Commonly known as:  RETIN-A Apply topically at bedtime.   XIIDRA 5 % Soln Generic drug:  Lifitegrast PLACE 1 DROP INTO BOTH EYES TWICE DAILY

## 2017-08-26 NOTE — Patient Instructions (Signed)
Will arrange for referral to ENT  Will arrange for home sleep study  Will call to arrange for follow up after sleep study reviewed

## 2017-09-01 ENCOUNTER — Encounter (HOSPITAL_COMMUNITY): Payer: Self-pay | Admitting: Licensed Clinical Social Worker

## 2017-09-01 ENCOUNTER — Ambulatory Visit (INDEPENDENT_AMBULATORY_CARE_PROVIDER_SITE_OTHER): Payer: 59 | Admitting: Licensed Clinical Social Worker

## 2017-09-01 DIAGNOSIS — F411 Generalized anxiety disorder: Secondary | ICD-10-CM | POA: Diagnosis not present

## 2017-09-01 DIAGNOSIS — F4321 Adjustment disorder with depressed mood: Secondary | ICD-10-CM | POA: Diagnosis not present

## 2017-09-01 DIAGNOSIS — Z634 Disappearance and death of family member: Secondary | ICD-10-CM

## 2017-09-01 NOTE — Progress Notes (Signed)
   THERAPIST PROGRESS NOTE  Session Time: 10:10-11am  Participation Level: Active  Behavioral Response: CasualAlert/Sad  Type of Therapy: Individual Therapy  Treatment Goals addressed:Improve psychiatric symptoms, Controlled Behavior, Moderated Mood, Improve Unhelpful Thought Patterns, Emotional Regulation Skills (Moderate moods, anger management, stress management), Feel and express a full Range of Emotions, Learn about Diagnosis, Healthy Coping Skills.     Interventions: CBT/Supportive  Summary: Sarah Wagner is a 57 y.o. female who presents for individual counseling appointment. Pt discussed her psychiatric symptoms and current life events. Pt presented sad today. She has been having bad memories of her daughter's fight with addiction, which are just popping up. She reports both she and her husband and sad and irritable. They have been comforting each other. She shared she has good days and not so good days. Processed with pt on the grief process, how its not linear and validating her feelings as normal. Pt again is blaming herself for her daughter's overdose, she didn't do enough. Asked open ended questions and processed this with her. Processed this statement: I know she's not coming back. Pt still is working through her grief.   Suicidal/Homicidal: Nowithout intent/plan  Therapist Response: Assessed pt's current functioning. Assisted pt processing grief, daughter's death Assisted pt processing for the management of her stressors.   Plan: Return again in 2 weeks.  Diagnosis: Axis I: Anxiety, Grief at the loss of a child    Rishabh Rinkenberger S, LCAS 09/01/17

## 2017-09-08 DIAGNOSIS — G4733 Obstructive sleep apnea (adult) (pediatric): Secondary | ICD-10-CM | POA: Diagnosis not present

## 2017-09-09 ENCOUNTER — Other Ambulatory Visit: Payer: Self-pay | Admitting: *Deleted

## 2017-09-09 DIAGNOSIS — R0683 Snoring: Secondary | ICD-10-CM

## 2017-09-10 ENCOUNTER — Telehealth: Payer: Self-pay | Admitting: Pulmonary Disease

## 2017-09-10 ENCOUNTER — Encounter: Payer: Self-pay | Admitting: Pulmonary Disease

## 2017-09-10 DIAGNOSIS — G4733 Obstructive sleep apnea (adult) (pediatric): Secondary | ICD-10-CM

## 2017-09-10 HISTORY — DX: Obstructive sleep apnea (adult) (pediatric): G47.33

## 2017-09-10 NOTE — Telephone Encounter (Signed)
HST 09/08/17 >> AHI 34.7, SaO2 low 56%   Please let her know sleep study shows severe obstructive sleep apnea with low oxygen.  Please schedule ROV with NP to discuss tx options >> auto CPAP versus CPAP titration study (preferred option).  Please also let her know that the issue she had with chest piece from home sleep study device didn't interfere with interpretation of test results.

## 2017-09-11 NOTE — Telephone Encounter (Signed)
Attempted to call patient today regarding results. I did not receive an answer at time of call. I have left a voicemail message for pt to return call. X1  

## 2017-09-11 NOTE — Telephone Encounter (Signed)
Pt is returning call. Cb is [phone].

## 2017-09-11 NOTE — Telephone Encounter (Signed)
Spoke with pt. She is aware of results. OV has been scheduled with Arlys JohnBrian on 09/12/17 at 3pm. Nothing further was needed.

## 2017-09-12 ENCOUNTER — Ambulatory Visit (INDEPENDENT_AMBULATORY_CARE_PROVIDER_SITE_OTHER): Payer: Managed Care, Other (non HMO) | Admitting: Pulmonary Disease

## 2017-09-12 ENCOUNTER — Encounter: Payer: Self-pay | Admitting: Pulmonary Disease

## 2017-09-12 VITALS — BP 122/78 | HR 94 | Ht 60.0 in | Wt 157.0 lb

## 2017-09-12 DIAGNOSIS — Z23 Encounter for immunization: Secondary | ICD-10-CM

## 2017-09-12 DIAGNOSIS — G4733 Obstructive sleep apnea (adult) (pediatric): Secondary | ICD-10-CM

## 2017-09-12 DIAGNOSIS — Z0000 Encounter for general adult medical examination without abnormal findings: Secondary | ICD-10-CM | POA: Insufficient documentation

## 2017-09-12 DIAGNOSIS — E669 Obesity, unspecified: Secondary | ICD-10-CM | POA: Insufficient documentation

## 2017-09-12 NOTE — Assessment & Plan Note (Signed)
Flu vaccine today 

## 2017-09-12 NOTE — Assessment & Plan Note (Signed)
Will place order for DME company to start you on CPAP, with mask of choice APAP settings 5-15  Return to office in 2 months showing 30-day CPAP compliance  We recommend that you continue using your CPAP daily >>>Keep up the hard work using your device >>> Goal should be wearing this for the entire night that you are sleeping, at least 4 to 6 hours  Remember:  . Do not drive or operate heavy machinery if tired or drowsy.  . Please notify the supply company and office if you are unable to use your device regularly due to missing supplies or machine being broken.  . Work on maintaining a healthy weight and following your recommended nutrition plan  . Maintain proper daily exercise and movement  . Maintaining proper use of your device can also help improve management of other chronic illnesses such as: Blood pressure, blood sugars, and weight management.   BiPAP/ CPAP Cleaning:  >>>Clean weekly, with Dawn soap, and bottle brush.  Set up to air dry.

## 2017-09-12 NOTE — Assessment & Plan Note (Signed)
Continue to work towards healthy weight 

## 2017-09-12 NOTE — Patient Instructions (Signed)
Will place order for CPAP with [facility] >>>5-15 auto titrating  Follow-up with our office in 2 months  Flu vaccine today  We recommend that you continue using your CPAP daily >>>Keep up the hard work using your device >>> Goal should be wearing this for the entire night that you are sleeping, at least 4 to 6 hours  Remember:  . Do not drive or operate heavy machinery if tired or drowsy.  . Please notify the supply company and office if you are unable to use your device regularly due to missing supplies or machine being broken.  . Work on maintaining a healthy weight and following your recommended nutrition plan  . Maintain proper daily exercise and movement  . Maintaining proper use of your device can also help improve management of other chronic illnesses such as: Blood pressure, blood sugars, and weight management.   BiPAP/ CPAP Cleaning:  >>>Clean weekly, with Dawn soap, and bottle brush.  Set up to air dry.    It is flu season:   >>>Remember to be washing your hands regularly, using hand sanitizer, be careful to use around herself with has contact with people who are sick will increase her chances of getting sick yourself. >>> Best ways to protect herself from the flu: Receive the yearly flu vaccine, practice good hand hygiene washing with soap and also using hand sanitizer when available, eat a nutritious meals, get adequate rest, hydrate appropriately   Please contact the office if your symptoms worsen or you have concerns that you are not improving.   Thank you for choosing [facility] for your healthcare, and for allowing us to partner with you on your healthcare journey. I am thankful to be able to provide care to you today.   Elisha HeadlandBrian Mack FNP-C

## 2017-09-12 NOTE — Progress Notes (Signed)
@Patient  ID: Sarah Wagner, female    DOB: 10/12/1960, 57 y.o.   MRN: [id number]  Chief Complaint  Patient presents with  . Follow-up    CPAP follow up / HST results     Referring provider: Gordy Savers, MD  HPI:  57 year old female patient followed in our office for obstructive sleep apnea (09/08/2017-AHI 34.7, SaO2 low of 56%)   Pt of: Dr. Craige Cotta    09/12/2017  - Visit   57 year old patient presenting today to receive home sleep study results as well as to discuss obstructive sleep apnea treatment.  09/08/2017- AHI 34.7, SaO2 low 56%.  Patient has also completed follow-up with ENT, patient says that they recommended deviated septum surgery.  Patient is not ready to complete surgery at this time.  Patient explained she would be more than willing to consider that summer 2020 but not currently right now.  Patient is apprehensive about home sleep study results and is ready to get treated.   Tests:   09/08/2017-AHI 34.7, SaO2 low 56%   Chart Review:     Specialty Problems      Pulmonary Problems   Allergic rhinitis    Qualifier: Diagnosis of  By: Amador Cunas  MD, Janett Labella       OSA (obstructive sleep apnea)    09/08/2017-AHI 34.7, SaO2 low of 56%         Allergies  Allergen Reactions  . Sulfacetamide Sodium     Immunization History  Administered Date(s) Administered  . Influenza Split 01/17/2011  . Influenza,inj,Quad PF,6+ Mos 09/12/2017  . Influenza-Unspecified 12/07/2014  . Tdap 09/02/2012  Flu vaccine to be given today  Past Medical History:  Diagnosis Date  . Allergy   . Anxiety   . Depression   . Headache(784.0)   . Hyperlipidemia   . OSA (obstructive sleep apnea) 09/10/2017  . Thyroid disease     Tobacco History: Social History   Tobacco Use  Smoking Status Never Smoker  Smokeless Tobacco Never Used   Counseling given: Not Answered Continue not smoking  Outpatient Encounter Medications as of 09/12/2017  Medication Sig  .  buPROPion (WELLBUTRIN XL) 150 MG 24 hr tablet TAKE 1 DAILY FOR DEPRESSION  . levothyroxine (SYNTHROID, LEVOTHROID) 75 MCG tablet Take 1 tablet (75 mcg total) by mouth daily.  Marland Kitchen LORazepam (ATIVAN) 0.5 MG tablet Take 0.5 mg by mouth as needed for anxiety.  Marland Kitchen oxymetazoline (AFRIN) 0.05 % nasal spray Place 1 spray into both nostrils 2 (two) times daily as needed for congestion.  . simvastatin (ZOCOR) 20 MG tablet TAKE 1 TABLET (20 MG TOTAL) BY MOUTH AT BEDTIME.  Marland Kitchen tretinoin (RETIN-A) 0.05 % cream Apply topically at bedtime.  Marland Kitchen XIIDRA 5 % SOLN PLACE 1 DROP INTO BOTH EYES TWICE DAILY  . citalopram (CELEXA) 40 MG tablet Take 1 tablet (40 mg total) by mouth daily.   No facility-administered encounter medications on file as of 09/12/2017.      Review of Systems  Review of Systems  Constitutional: Positive for fatigue. Negative for chills, fever and unexpected weight change.  HENT: Negative for congestion, ear pain, postnasal drip, sinus pressure and sinus pain.   Respiratory: Negative for cough, chest tightness, shortness of breath and wheezing.   Cardiovascular: Negative for chest pain and palpitations.  Gastrointestinal: Negative for blood in stool, diarrhea, nausea and vomiting.  Genitourinary: Negative for dysuria, frequency and urgency.  Musculoskeletal: Negative for arthralgias.  Skin: Negative for color change.  Allergic/Immunologic: Negative for environmental allergies and  food allergies.  Neurological: Negative for dizziness, light-headedness and headaches.  Psychiatric/Behavioral: Positive for sleep disturbance. Negative for dysphoric mood. The patient is nervous/anxious.   All other systems reviewed and are negative.    Physical Exam  BP 122/78 (BP Location: Left Arm, Cuff Size: Normal)   Pulse 94   Ht 5' (1.524 m)   Wt 157 lb (71.2 kg)   SpO2 95%   BMI 30.66 kg/m   Wt Readings from Last 5 Encounters:  09/12/17 157 lb (71.2 kg)  08/26/17 156 lb 12.8 oz (71.1 kg)  07/07/17  155 lb (70.3 kg)  07/26/16 155 lb 12.8 oz (70.7 kg)  07/05/16 157 lb 9.6 oz (71.5 kg)     Physical Exam  Constitutional: She is oriented to person, place, and time and well-developed, well-nourished, and in no distress. No distress.  HENT:  Head: Normocephalic and atraumatic.  Right Ear: Hearing, tympanic membrane, external ear and ear canal normal.  Left Ear: Hearing, tympanic membrane, external ear and ear canal normal.  Mouth/Throat: Uvula is midline and oropharynx is clear and moist. No oropharyngeal exudate.  Eyes: Pupils are equal, round, and reactive to light.  Neck: Normal range of motion. Neck supple. No JVD present.  Cardiovascular: Normal rate, regular rhythm and normal heart sounds.  Pulmonary/Chest: Effort normal and breath sounds normal. No accessory muscle usage. No respiratory distress. She has no decreased breath sounds. She has no wheezes. She has no rhonchi.  Lymphadenopathy:    She has no cervical adenopathy.  Neurological: She is alert and oriented to person, place, and time. Gait normal.  Skin: Skin is warm and dry. She is not diaphoretic. No erythema.  Psychiatric: Mood, memory, affect and judgment normal.  Nursing note and vitals reviewed.     Lab Results:  CBC    Component Value Date/Time   WBC 7.9 07/07/2017 1429   RBC 4.86 07/07/2017 1429   HGB 13.9 07/07/2017 1429   HCT 41.6 07/07/2017 1429   PLT 330.0 07/07/2017 1429   MCV 85.7 07/07/2017 1429   MCH 28.1 04/30/2015 2036   MCHC 33.4 07/07/2017 1429   RDW 13.6 07/07/2017 1429   LYMPHSABS 2.1 07/07/2017 1429   MONOABS 0.5 07/07/2017 1429   EOSABS 0.2 07/07/2017 1429   BASOSABS 0.0 07/07/2017 1429    BMET    Component Value Date/Time   NA 141 07/07/2017 1429   K 4.5 07/07/2017 1429   CL 104 07/07/2017 1429   CO2 29 07/07/2017 1429   GLUCOSE 108 (H) 07/07/2017 1429   BUN 11 07/07/2017 1429   CREATININE 0.89 07/07/2017 1429   CALCIUM 9.9 07/07/2017 1429   GFRNONAA >60 04/30/2015 2036    GFRAA >60 04/30/2015 2036    BNP No results found for: BNP  ProBNP No results found for: PROBNP  Imaging: No results found.    Assessment & Plan:   Pleasant 57 year old patient seen today in office visit.  Will start patient on CPAP with auto titration settings of 5-15.  Patient return to our office in 2 months to show at least 30 days compliance to CPAP therapy.  Okay to delay proceeding forward with surgery until next summer.  OSA (obstructive sleep apnea) Will place order for DME company to start you on CPAP, with mask of choice APAP settings 5-15  Return to office in 2 months showing 30-day CPAP compliance  We recommend that you continue using your CPAP daily >>>Keep up the hard work using your device >>> Goal should be wearing  this for the entire night that you are sleeping, at least 4 to 6 hours  Remember:  . Do not drive or operate heavy machinery if tired or drowsy.  . Please notify the supply company and office if you are unable to use your device regularly due to missing supplies or machine being broken.  . Work on maintaining a healthy weight and following your recommended nutrition plan  . Maintain proper daily exercise and movement  . Maintaining proper use of your device can also help improve management of other chronic illnesses such as: Blood pressure, blood sugars, and weight management.   BiPAP/ CPAP Cleaning:  >>>Clean weekly, with Dawn soap, and bottle brush.  Set up to air dry.       Coral CeoBrian P Britten Parady, NP 09/12/2017

## 2017-09-16 ENCOUNTER — Ambulatory Visit (HOSPITAL_COMMUNITY): Payer: Self-pay | Admitting: Licensed Clinical Social Worker

## 2017-09-18 ENCOUNTER — Encounter (HOSPITAL_COMMUNITY): Payer: Self-pay | Admitting: Licensed Clinical Social Worker

## 2017-09-18 ENCOUNTER — Ambulatory Visit (INDEPENDENT_AMBULATORY_CARE_PROVIDER_SITE_OTHER): Payer: Self-pay | Admitting: Licensed Clinical Social Worker

## 2017-09-18 DIAGNOSIS — Z634 Disappearance and death of family member: Secondary | ICD-10-CM

## 2017-09-18 DIAGNOSIS — F411 Generalized anxiety disorder: Secondary | ICD-10-CM

## 2017-09-18 DIAGNOSIS — F4321 Adjustment disorder with depressed mood: Secondary | ICD-10-CM

## 2017-09-18 NOTE — Progress Notes (Signed)
   THERAPIST PROGRESS NOTE  Session Time: 2:10-3pm  Participation Level: Active  Behavioral Response: CasualAlert/Sad  Type of Therapy: Individual Therapy  Treatment Goals addressed:Improve psychiatric symptoms, Controlled Behavior, Moderated Mood, Improve Unhelpful Thought Patterns, Emotional Regulation Skills (Moderate moods, anger management, stress management), Feel and express a full Range of Emotions, Learn about Diagnosis, Healthy Coping Skills.     Interventions: CBT/Supportive  Summary: Sarah Wagner is a 57 y.o. female who presents for individual counseling appointment. Pt discussed her psychiatric symptoms and current life events. Pt presented sad today. She is experiencing symptoms of grief today. She shared she feels some shame from her daughter's death. Validated pt's feelings. Asked open ended questions and used empathic reflection. Pt continues in support groups at [facility]osp[facility]. Discussed her "purpose in life", making this a priority.    Suicidal/Homicidal: Nowithout intent/plan  Therapist Response: Assessed pt's current functioning. Assisted pt processing grief, shame , purpose in life.Assisted pt processing for the management of her stressors.   Plan: Return again in 2 weeks.  Diagnosis: Axis I: Anxiety, Grief at the loss of a child    Collen Vincent S, LCAS 09/18/17

## 2017-09-23 ENCOUNTER — Ambulatory Visit (HOSPITAL_COMMUNITY): Payer: Self-pay | Admitting: Licensed Clinical Social Worker

## 2017-10-06 ENCOUNTER — Ambulatory Visit (HOSPITAL_COMMUNITY): Payer: Self-pay | Admitting: Licensed Clinical Social Worker

## 2017-10-14 ENCOUNTER — Ambulatory Visit (INDEPENDENT_AMBULATORY_CARE_PROVIDER_SITE_OTHER): Payer: 59 | Admitting: Licensed Clinical Social Worker

## 2017-10-14 ENCOUNTER — Encounter (HOSPITAL_COMMUNITY): Payer: Self-pay | Admitting: Licensed Clinical Social Worker

## 2017-10-14 DIAGNOSIS — Z634 Disappearance and death of family member: Secondary | ICD-10-CM

## 2017-10-14 DIAGNOSIS — F411 Generalized anxiety disorder: Secondary | ICD-10-CM

## 2017-10-14 DIAGNOSIS — F4321 Adjustment disorder with depressed mood: Secondary | ICD-10-CM

## 2017-10-14 NOTE — Progress Notes (Signed)
   THERAPIST PROGRESS NOTE  Session Time: 2:10-3pm  Participation Level: Active  Behavioral Response: CasualAlert/anxious  Type of Therapy: Individual Therapy  Treatment Goals addressed:Improve psychiatric symptoms, Controlled Behavior, Moderated Mood, Improve Unhelpful Thought Patterns, Emotional Regulation Skills (Moderate moods, anger management, stress management), Feel and express a full Range of Emotions, Learn about Diagnosis, Healthy Coping Skills.     Interventions: CBT/Supportive  Summary: Sarah Wagner is a 57 y.o. female who presents for individual counseling appointment. Pt discussed her psychiatric symptoms and current life events. Pt presented anxious today. She has not been to therapy in 4 weeks due to brief changes in her work schedule. She will now be able to come to therapy regularly. Pt has had some sleep issues and now has to wear a CPAP machine. This has affected her sleep for the last 2 weeks. She shared she has had a rough couple of weeks, change in the weather and seasons, reminding her of her daughter. She and her husband attended the hospice group within the last month. Asked open ended questions about the benefits of the group. Talked with pt about how fear can manifest into anxiety. Pt reported she understood the concept. Processed with pt acceptance vs disbelief, how she deals with her daughter's death.    Suicidal/Homicidal: Nowithout intent/plan  Therapist Response: Assessed pt's current functioning. Assisted pt processing grief, sleep issue, hospice group, fear. Assisted pt processing for the management of her stressors.   Plan: Return again in 2 weeks.  Diagnosis: Axis I: Anxiety, Grief at the loss of a child    Randi Poullard S, LCAS 10/14/17

## 2017-10-21 ENCOUNTER — Ambulatory Visit (HOSPITAL_COMMUNITY): Payer: Self-pay | Admitting: Licensed Clinical Social Worker

## 2017-10-28 ENCOUNTER — Ambulatory Visit (HOSPITAL_COMMUNITY): Payer: Self-pay | Admitting: Licensed Clinical Social Worker

## 2017-11-04 ENCOUNTER — Ambulatory Visit (HOSPITAL_COMMUNITY): Payer: Self-pay | Admitting: Licensed Clinical Social Worker

## 2017-11-11 ENCOUNTER — Encounter (HOSPITAL_COMMUNITY): Payer: Self-pay | Admitting: Licensed Clinical Social Worker

## 2017-11-11 ENCOUNTER — Ambulatory Visit (INDEPENDENT_AMBULATORY_CARE_PROVIDER_SITE_OTHER): Payer: 59 | Admitting: Licensed Clinical Social Worker

## 2017-11-11 DIAGNOSIS — F4321 Adjustment disorder with depressed mood: Secondary | ICD-10-CM

## 2017-11-11 DIAGNOSIS — Z634 Disappearance and death of family member: Secondary | ICD-10-CM

## 2017-11-11 DIAGNOSIS — F411 Generalized anxiety disorder: Secondary | ICD-10-CM | POA: Diagnosis not present

## 2017-11-11 NOTE — Progress Notes (Signed)
   THERAPIST PROGRESS NOTE  Session Time: 2:10-3pm  Participation Level: Active  Behavioral Response: CasualAlert/anxious  Type of Therapy: Individual Therapy  Treatment Goals addressed:Improve psychiatric symptoms, Controlled Behavior, Moderated Mood, Improve Unhelpful Thought Patterns, Emotional Regulation Skills (Moderate moods, anger management, stress management), Feel and express a full Range of Emotions, Learn about Diagnosis, Healthy Coping Skills.     Interventions: CBT/Supportive  Summary: Sarah Wagner is a 57 y.o. female who presents for individual counseling appointment. Pt discussed her psychiatric symptoms and current life events. Pt presented anxious today. She has not been to therapy in 4 weeks due to brief changes in her work schedule. She now has to discontinue therapy because her husband has a new job and she doesn't know about her insurance yet. Pt continues to struggle with the loss of her daughter. She is dreading the upcoming holidays. Worked with pt on pre-planning her holidays, making sure she is busy and surrounding herself with family and friends. Suggested to pt she and her husband must make their new holiday traditions for themselves. Pt and her husband still continue in hospice group with benefits. Discussed the benefits, asking open ended questions.    Suicidal/Homicidal: Nowithout intent/plan  Therapist Response: Assessed pt's current functioning. Assisted pt processing grief, new traditions, holidays, hospice group, fear. Assisted pt processing for the management of her stressors.   Plan: Discontinue therapy due to insurance at this time  Diagnosis: Axis I: Anxiety, Grief at the loss of a child    Glennis Borger S, LCAS 11/11/17

## 2017-11-13 ENCOUNTER — Encounter: Payer: Self-pay | Admitting: Pulmonary Disease

## 2017-11-13 ENCOUNTER — Ambulatory Visit (INDEPENDENT_AMBULATORY_CARE_PROVIDER_SITE_OTHER): Payer: Managed Care, Other (non HMO) | Admitting: Pulmonary Disease

## 2017-11-13 VITALS — BP 116/68 | HR 88 | Ht 60.75 in | Wt 158.8 lb

## 2017-11-13 DIAGNOSIS — G4733 Obstructive sleep apnea (adult) (pediatric): Secondary | ICD-10-CM

## 2017-11-13 NOTE — Progress Notes (Signed)
@Patient  ID: Sarah Wagner, female    DOB: 05/18/1960, 57 y.o.   MRN: [id number]  Chief Complaint  Patient presents with  . Follow-up    CPAP follow up     Referring provider: Gordy Savers, MD  HPI:  57 year old female patient followed in our office for obstructive sleep apnea (09/08/2017-AHI 34.7, SaO2 low of 56%)   Smoker/ Smoking History: Never Smoker Maintenance:   Pt of: Dr. Craige Cotta   11/13/2017  - Visit   57 year old female patient presenting today for follow-up visit after CPAP start.  Patient reporting that she is attempted to start her CPAP.  Patient reports she is has not been able to get the right fit as she feels that the noise machine is too loud and keeps her up.  With the mask fits.  Patient does not feel there is a leak.  Patient does feel that sometimes when she initially starts suffocating and she is not getting.  Patient does not report any improvement with her daytime fatigue or sleepiness.  CPAP compliance report showing 20 out of 30 days use, 8 of those days greater than 4 hours, average usage 3 hours and 4 minutes.  APAP pressures 5-15.  95th percentile 14.1. AHI 10.1.  No significant leak.  Patient using nasal mask without chinstrap.  Patient reports she has scheduled follow-up with ENT for potential future sinus surgery.  Tests:  09/08/2017-AHI 34.7, SaO2 low 56%   FENO:  No results found for: NITRICOXIDE  PFT: No flowsheet data found.  Imaging: No results found.  Chart Review:    Specialty Problems      Pulmonary Problems   Allergic rhinitis    Qualifier: Diagnosis of  By: Amador Cunas  MD, Janett Labella       OSA (obstructive sleep apnea)    09/08/2017-AHI 34.7, SaO2 low of 56%         Allergies  Allergen Reactions  . Sulfacetamide Sodium     Immunization History  Administered Date(s) Administered  . Influenza Split 01/17/2011  . Influenza,inj,Quad PF,6+ Mos 09/12/2017  . Influenza-Unspecified 12/07/2014  . Tdap 09/02/2012     Past Medical History:  Diagnosis Date  . Allergy   . Anxiety   . Depression   . Headache(784.0)   . Hyperlipidemia   . OSA (obstructive sleep apnea) 09/10/2017  . Thyroid disease     Tobacco History: Social History   Tobacco Use  Smoking Status Never Smoker  Smokeless Tobacco Never Used   Counseling given: Not Answered Continue to not smoke  Outpatient Encounter Medications as of 11/13/2017  Medication Sig  . buPROPion (WELLBUTRIN XL) 150 MG 24 hr tablet TAKE 1 DAILY FOR DEPRESSION  . levothyroxine (SYNTHROID, LEVOTHROID) 75 MCG tablet Take 1 tablet (75 mcg total) by mouth daily.  Marland Kitchen LORazepam (ATIVAN) 0.5 MG tablet Take 0.5 mg by mouth as needed for anxiety.  Marland Kitchen oxymetazoline (AFRIN) 0.05 % nasal spray Place 1 spray into both nostrils 2 (two) times daily as needed for congestion.  . simvastatin (ZOCOR) 20 MG tablet TAKE 1 TABLET (20 MG TOTAL) BY MOUTH AT BEDTIME.  Marland Kitchen XIIDRA 5 % SOLN PLACE 1 DROP INTO BOTH EYES TWICE DAILY  . citalopram (CELEXA) 40 MG tablet Take 1 tablet (40 mg total) by mouth daily.  Marland Kitchen tretinoin (RETIN-A) 0.05 % cream Apply topically at bedtime. (Patient not taking: Reported on 11/13/2017)   No facility-administered encounter medications on file as of 11/13/2017.      Review of Systems  Review of Systems  Constitutional: Negative for chills, fatigue, fever and unexpected weight change.  HENT: Negative for congestion, ear pain, postnasal drip, sinus pressure and sinus pain.   Respiratory: Negative for cough, chest tightness, shortness of breath and wheezing.   Cardiovascular: Negative for chest pain and palpitations.  Gastrointestinal: Negative for blood in stool, diarrhea, nausea and vomiting.  Genitourinary: Negative for dysuria, frequency and urgency.  Musculoskeletal: Negative for arthralgias.  Skin: Negative for color change.  Allergic/Immunologic: Negative for environmental allergies and food allergies.  Neurological: Negative for dizziness,  light-headedness and headaches.  Psychiatric/Behavioral: Negative for dysphoric mood. The patient is not nervous/anxious.   All other systems reviewed and are negative.    Physical Exam  BP 116/68 (BP Location: Left Arm, Cuff Size: Normal)   Pulse 88   Ht 5' 0.75" (1.543 m)   Wt 158 lb 12.8 oz (72 kg)   SpO2 97%   BMI 30.25 kg/m   Wt Readings from Last 5 Encounters:  11/13/17 158 lb 12.8 oz (72 kg)  09/12/17 157 lb (71.2 kg)  08/26/17 156 lb 12.8 oz (71.1 kg)  07/07/17 155 lb (70.3 kg)  07/26/16 155 lb 12.8 oz (70.7 kg)     Physical Exam  Constitutional: She is oriented to person, place, and time and well-developed, well-nourished, and in no distress. No distress.  HENT:  Head: Normocephalic and atraumatic.  Right Ear: Hearing, tympanic membrane, external ear and ear canal normal.  Left Ear: Hearing, tympanic membrane, external ear and ear canal normal.  Nose: Nose normal. Right sinus exhibits no maxillary sinus tenderness and no frontal sinus tenderness. Left sinus exhibits no maxillary sinus tenderness and no frontal sinus tenderness.  Mouth/Throat: Uvula is midline and oropharynx is clear and moist. No oropharyngeal exudate.  Eyes: Pupils are equal, round, and reactive to light.  Neck: Normal range of motion. Neck supple. No JVD present.  Cardiovascular: Normal rate, regular rhythm and normal heart sounds.  Pulmonary/Chest: Effort normal and breath sounds normal. No accessory muscle usage. No respiratory distress. She has no decreased breath sounds. She has no wheezes. She has no rhonchi.  Abdominal: Soft. Bowel sounds are normal. There is no tenderness.  Musculoskeletal: Normal range of motion. She exhibits no edema.  Lymphadenopathy:    She has no cervical adenopathy.  Neurological: She is alert and oriented to person, place, and time. Gait normal.  Skin: Skin is warm and dry. She is not diaphoretic. No erythema.  Psychiatric: Mood, memory, affect and judgment normal.    Nursing note and vitals reviewed.    Lab Results:  CBC    Component Value Date/Time   WBC 7.9 07/07/2017 1429   RBC 4.86 07/07/2017 1429   HGB 13.9 07/07/2017 1429   HCT 41.6 07/07/2017 1429   PLT 330.0 07/07/2017 1429   MCV 85.7 07/07/2017 1429   MCH 28.1 04/30/2015 2036   MCHC 33.4 07/07/2017 1429   RDW 13.6 07/07/2017 1429   LYMPHSABS 2.1 07/07/2017 1429   MONOABS 0.5 07/07/2017 1429   EOSABS 0.2 07/07/2017 1429   BASOSABS 0.0 07/07/2017 1429    BMET    Component Value Date/Time   NA 141 07/07/2017 1429   K 4.5 07/07/2017 1429   CL 104 07/07/2017 1429   CO2 29 07/07/2017 1429   GLUCOSE 108 (H) 07/07/2017 1429   BUN 11 07/07/2017 1429   CREATININE 0.89 07/07/2017 1429   CALCIUM 9.9 07/07/2017 1429   GFRNONAA >60 04/30/2015 2036   GFRAA >60 04/30/2015 2036  BNP No results found for: BNP  ProBNP No results found for: PROBNP    Assessment & Plan:   57 year old female patient completing follow-up today will adjust patient's CPAP settings.  We will also add chinstrap.  Patient provided nasal pillow sample mask per her request.  Patient to follow-up in 6 to 8 weeks.  We will check compliance at that time.  May need to consider CPAP titration study in the future if patient continues to have problems with adherence and pressure.  I am not sure that the patient would be a good candidate for an oral appliance.  Could consider a OSA nerve stimulating implant if she continues to fail CPAP therapy.  OSA (obstructive sleep apnea) We will order a chinstrap today for you to use with your mass  We will review nasal mask options and samples with you today  We will change her auto CPAP settings to 10-18  Follow-up in 6 to 8 weeks   We recommend that you continue using your CPAP daily >>>Keep up the hard work using your device >>> Goal should be wearing this for the entire night that you are sleeping, at least 4 to 6 hours  Remember:  . Do not drive or operate  heavy machinery if tired or drowsy.  . Please notify the supply company and office if you are unable to use your device regularly due to missing supplies or machine being broken.  . Work on maintaining a healthy weight and following your recommended nutrition plan  . Maintain proper daily exercise and movement  . Maintaining proper use of your device can also help improve management of other chronic illnesses such as: Blood pressure, blood sugars, and weight management.   BiPAP/ CPAP Cleaning:  >>>Clean weekly, with Dawn soap, and bottle brush.  Set up to air dry.    This appointment was 27 minutes along with her 50% of the time direct face-to-face patient care, assessment, plan of care follow-up and discussion of CPAP setting adjustments.   Coral Ceo, NP 11/13/2017

## 2017-11-13 NOTE — Patient Instructions (Signed)
We will order a chinstrap today for you to use with your mass  We will review nasal mask options and samples with you today  We will change her auto CPAP settings to 10-18  Follow-up in 6 to 8 weeks   We recommend that you continue using your CPAP daily >>>Keep up the hard work using your device >>> Goal should be wearing this for the entire night that you are sleeping, at least 4 to 6 hours  Remember:  . Do not drive or operate heavy machinery if tired or drowsy.  . Please notify the supply company and office if you are unable to use your device regularly due to missing supplies or machine being broken.  . Work on maintaining a healthy weight and following your recommended nutrition plan  . Maintain proper daily exercise and movement  . Maintaining proper use of your device can also help improve management of other chronic illnesses such as: Blood pressure, blood sugars, and weight management.   BiPAP/ CPAP Cleaning:  >>>Clean weekly, with Dawn soap, and bottle brush.  Set up to air dry.            November/2019 we will be moving! We will no longer be at our [place] location.  Be on the look out for a post card/mailer to let you know we have officially moved.  Our new address and phone number will be:  [address] [place], [place] [address] Telephone number: [phone]  It is flu season:   >>>Remember to be washing your hands regularly, using hand sanitizer, be careful to use around herself with has contact with people who are sick will increase her chances of getting sick yourself. >>> Best ways to protect herself from the flu: Receive the yearly flu vaccine, practice good hand hygiene washing with soap and also using hand sanitizer when available, eat a nutritious meals, get adequate rest, hydrate appropriately   Please contact the office if your symptoms worsen or you have concerns that you are not improving.   Thank you for choosing [facility] for your healthcare, and for allowing [place] to partner with you on your healthcare journey. I am thankful to be able to provide care to you today.   Elisha Headland FNP-C

## 2017-11-13 NOTE — Assessment & Plan Note (Signed)
We will order a chinstrap today for you to use with your mass  We will review nasal mask options and samples with you today  We will change her auto CPAP settings to 10-18  Follow-up in 6 to 8 weeks   We recommend that you continue using your CPAP daily >>>Keep up the hard work using your device >>> Goal should be wearing this for the entire night that you are sleeping, at least 4 to 6 hours  Remember:  . Do not drive or operate heavy machinery if tired or drowsy.  . Please notify the supply company and office if you are unable to use your device regularly due to missing supplies or machine being broken.  . Work on maintaining a healthy weight and following your recommended nutrition plan  . Maintain proper daily exercise and movement  . Maintaining proper use of your device can also help improve management of other chronic illnesses such as: Blood pressure, blood sugars, and weight management.   BiPAP/ CPAP Cleaning:  >>>Clean weekly, with Dawn soap, and bottle brush.  Set up to air dry.

## 2017-11-14 NOTE — Progress Notes (Signed)
Reviewed and agree with assessment/plan.   Shaindy Reader, MD [facility] 01/10/2016, 12:24 PM Pager:  [phone]  

## 2017-12-02 ENCOUNTER — Ambulatory Visit (HOSPITAL_COMMUNITY): Payer: Self-pay | Admitting: Licensed Clinical Social Worker

## 2017-12-16 ENCOUNTER — Ambulatory Visit (HOSPITAL_COMMUNITY): Payer: Self-pay | Admitting: Licensed Clinical Social Worker

## 2017-12-29 ENCOUNTER — Ambulatory Visit (INDEPENDENT_AMBULATORY_CARE_PROVIDER_SITE_OTHER): Payer: 59 | Admitting: Pulmonary Disease

## 2017-12-29 ENCOUNTER — Encounter: Payer: Self-pay | Admitting: Pulmonary Disease

## 2017-12-29 VITALS — BP 108/72 | HR 78 | Ht 61.0 in | Wt 159.0 lb

## 2017-12-29 DIAGNOSIS — G4733 Obstructive sleep apnea (adult) (pediatric): Secondary | ICD-10-CM

## 2017-12-29 NOTE — Patient Instructions (Signed)
Follow up in 1 year.

## 2017-12-29 NOTE — Progress Notes (Signed)
[facility]  Chief Complaint  Patient presents with  . Follow-up    Pt doing well overall with cpap machine. Pt states chin strap didn't work well she is not using it at this time.    Constitutional:  BP 108/72 (BP Location: Left Arm, Cuff Size: Normal)   Pulse 78   Ht 5\' 1"  (1.549 m)   Wt 159 lb (72.1 kg)   SpO2 93%   BMI 30.04 kg/m   Past Medical History:  Hypothyroidism, HLD, HA, Depression, Anxiety, Allergies  Brief Summary:  Sarah Wagner is a 57 y.o. female with obstructive sleep apnea.  She has been doing better with CPAP.  Using nasal pillows now and this has helped.  Not having dry mouth, sore throat, or aerophagia.  She saw ENT for nasal valve collapse.  She was referred to Dr. Jeral Pinch with facial plastic surgery and has appointment later this month.   Physical Exam:   Appearance - well kempt   ENMT - clear nasal mucosa, midline nasal  septum, no oral exudates, no LAN, trachea midline, narrow nasal passages with alar collapse, MP 3, enlarged tongue  Respiratory - normal chest wall, normal respiratory effort, no accessory muscle use, no wheeze/rales  CV - s1s2 regular rate and rhythm, no murmurs, no peripheral edema, radial pulses symmetric  GI - soft, non tender, no masses  Lymph - no adenopathy noted in neck and axillary areas  MSK - normal gait  Ext - no cyanosis, clubbing, or joint inflammation noted  Skin - no rashes, lesions, or ulcers  Neuro - normal strength, oriented x 3  Psych - normal mood and affect   Assessment/Plan:   Obstructive sleep apnea. - she is compliant with CPAP and reports benefit from therapy - continue auto CPAP 10 - 18 cm H2O - discussed mask options  Nasal valve collapse. - she has appt with Dr. Jeral Pinch with facial plastic surgery   Patient Instructions  Follow up in 1 year    Coralyn Helling, MD [facility] Pager: [phone] 12/29/2017, 3:05 PM  Flow  Sheet    Sleep tests:  HST 09/08/17 >> AHI 34.7, SaO2 low 56% Auto CPAP 11/29/17 to 12/28/17 >> used on 29 of 30 nights with average 4 hrs 28 min.  Average AHI 6.2 with median CPAP 14 and 95 th percentile CPAP 16 cm H2O  Medications:   Allergies as of 12/29/2017      Reactions   Sulfacetamide Sodium       Medication List       Accurate as of December 29, 2017  3:05 PM. Always use your most recent med list.        buPROPion 150 MG 24 hr tablet Commonly known as:  WELLBUTRIN XL TAKE 1 DAILY FOR DEPRESSION   citalopram 40 MG tablet Commonly known as:  CELEXA Take 1 tablet (40 mg total) by mouth daily.   levothyroxine 75 MCG tablet Commonly known as:  SYNTHROID, LEVOTHROID Take 1 tablet (75 mcg total) by mouth daily.   LORazepam 0.5 MG tablet Commonly known as:  ATIVAN Take 0.5 mg by mouth as needed for anxiety.   oxymetazoline 0.05 % nasal spray Commonly known as:  AFRIN Place 1 spray into both nostrils 2 (two) times daily as needed for congestion.   simvastatin 20 MG tablet Commonly known as:  ZOCOR TAKE 1 TABLET (20 MG TOTAL) BY MOUTH AT BEDTIME.   tretinoin 0.05 % cream Commonly known as:  RETIN-A Apply topically at bedtime.   XIIDRA 5 % Soln Generic drug:  Lifitegrast PLACE 1 DROP INTO BOTH EYES TWICE DAILY       Past Surgical History:  She  has a past surgical history that includes Hernia repair and Abdominal hysterectomy.  Family History:  Her family history includes Anxiety disorder in her father and mother; Cancer in her father and mother; Crohn's disease in her father and sister; Depression in her father and mother; Diabetes in her father; Luiz BlareGraves' disease in her father; Hyperlipidemia in her father; Parkinsonism in her mother.  Social History:  She  reports that she has never smoked. She has never used smokeless tobacco. She reports that she does not drink alcohol or use drugs.

## 2017-12-30 ENCOUNTER — Ambulatory Visit (HOSPITAL_COMMUNITY): Payer: Self-pay | Admitting: Licensed Clinical Social Worker

## 2018-01-20 DIAGNOSIS — J3489 Other specified disorders of nose and nasal sinuses: Secondary | ICD-10-CM | POA: Diagnosis not present

## 2018-01-20 DIAGNOSIS — J342 Deviated nasal septum: Secondary | ICD-10-CM | POA: Diagnosis not present

## 2018-01-20 DIAGNOSIS — J343 Hypertrophy of nasal turbinates: Secondary | ICD-10-CM | POA: Diagnosis not present

## 2018-01-22 DIAGNOSIS — G4733 Obstructive sleep apnea (adult) (pediatric): Secondary | ICD-10-CM | POA: Diagnosis not present

## 2018-01-27 DIAGNOSIS — G4733 Obstructive sleep apnea (adult) (pediatric): Secondary | ICD-10-CM | POA: Diagnosis not present

## 2018-01-28 ENCOUNTER — Ambulatory Visit (HOSPITAL_COMMUNITY): Payer: Self-pay | Admitting: Licensed Clinical Social Worker

## 2018-02-10 DIAGNOSIS — R5383 Other fatigue: Secondary | ICD-10-CM | POA: Diagnosis not present

## 2018-02-10 DIAGNOSIS — Z20828 Contact with and (suspected) exposure to other viral communicable diseases: Secondary | ICD-10-CM | POA: Diagnosis not present

## 2018-02-10 DIAGNOSIS — J00 Acute nasopharyngitis [common cold]: Secondary | ICD-10-CM | POA: Diagnosis not present

## 2018-02-18 DIAGNOSIS — H66002 Acute suppurative otitis media without spontaneous rupture of ear drum, left ear: Secondary | ICD-10-CM | POA: Diagnosis not present

## 2018-02-20 DIAGNOSIS — G4733 Obstructive sleep apnea (adult) (pediatric): Secondary | ICD-10-CM | POA: Diagnosis not present

## 2018-02-22 DIAGNOSIS — G4733 Obstructive sleep apnea (adult) (pediatric): Secondary | ICD-10-CM | POA: Diagnosis not present

## 2018-02-24 ENCOUNTER — Ambulatory Visit (INDEPENDENT_AMBULATORY_CARE_PROVIDER_SITE_OTHER): Payer: 59 | Admitting: Family Medicine

## 2018-02-24 ENCOUNTER — Encounter: Payer: Self-pay | Admitting: Family Medicine

## 2018-02-24 VITALS — BP 122/72 | HR 84 | Temp 98.2°F | Ht 61.0 in | Wt 157.9 lb

## 2018-02-24 DIAGNOSIS — H9202 Otalgia, left ear: Secondary | ICD-10-CM | POA: Diagnosis not present

## 2018-02-24 NOTE — Progress Notes (Signed)
HPI:  Using dictation device. Unfortunately this device frequently misinterprets words/phrases.  Acute visit for L ear pain: -started 1-2 weeks ago with resp illness -[facility] started [facility] Amoxicillin 2/5 for OM -reports better, but some "popping" at times, some discomfort remains -denies: fevers, malaise, drainage from ear, hearing loss, pul tin   ROS: See pertinent positives and negatives per HPI.  Past Medical History:  Diagnosis Date  . Allergy   . Anxiety   . Depression   . Headache(784.0)   . Hyperlipidemia   . OSA (obstructive sleep apnea) 09/10/2017  . Thyroid disease     Past Surgical History:  Procedure Laterality Date  . ABDOMINAL HYSTERECTOMY    . HERNIA REPAIR      Family History  Problem Relation Age of Onset  . Cancer Mother        ovarian  . Parkinsonism Mother   . Depression Mother   . Anxiety disorder Mother   . Cancer Father        brain  . Diabetes Father   . Crohn's disease Father   . Graves' disease Father   . Hyperlipidemia Father   . Depression Father   . Anxiety disorder Father   . Crohn's disease Sister     SOCIAL HX: see hpi   Current Outpatient Medications:  .  buPROPion (WELLBUTRIN XL) 300 MG 24 hr tablet, Take 300 mg by mouth daily., Disp: , Rfl:  .  levothyroxine (SYNTHROID, LEVOTHROID) 75 MCG tablet, Take 1 tablet (75 mcg total) by mouth daily., Disp: 90 tablet, Rfl: 3 .  LORazepam (ATIVAN) 0.5 MG tablet, Take 0.5 mg by mouth as needed for anxiety., Disp: , Rfl:  .  simvastatin (ZOCOR) 20 MG tablet, TAKE 1 TABLET (20 MG TOTAL) BY MOUTH AT BEDTIME., Disp: 90 tablet, Rfl: 4 .  XIIDRA 5 % SOLN, PLACE 1 DROP INTO BOTH EYES TWICE DAILY, Disp: , Rfl: 4 .  citalopram (CELEXA) 40 MG tablet, Take 1 tablet (40 mg total) by mouth daily., Disp: 30 tablet, Rfl: 2  EXAM:  Vitals:   02/24/18 1049  BP: 122/72  Pulse: 84  Temp: 98.2 F (36.8 C)    Body mass index is 29.83 kg/m.  GENERAL: vitals reviewed and listed above,  alert, oriented, appears well hydrated and in no acute distress  HEENT: atraumatic, conjunttiva clear, no obvious abnormalities on inspection of external nose and ears, normal appearance of ear canals and TMs, clear nasal congestion, mild post oropharyngeal erythema with PND, no tonsillar edema or exudate, no sinus TTP  NECK: no obvious masses on inspection  LUNGS: clear to auscultation bilaterally, no wheezes, rales or rhonchi, good air movement  CV: HRRR, no peripheral edema  MS: moves all extremities without noticeable abnormality  PSYCH: pleasant and cooperative, no obvious depression or anxiety  ASSESSMENT AND PLAN:  Discussed the following assessment and plan:  Left ear pain  --we discussed possible serious and likely etiologies, workup and treatment, treatment risks and return precautions; suspect ETD following recent OM - no signs of infection on exam today - vs other -after this discussion, Sarah Wagner opted for trial INS, NS, massage - ENT eval if worsening or persists - [place] and agrees to call there if any worsening or not improving over the next 5-7 days -she has no PCP currently, she was considering [facility], but wanted to know options here as well - prefers to see internist as reports was told with her family history she  should always see an internist.   Patient Instructions  BEFORE YOU LEAVE: -follow up: schedule NPV with Dr. Ardyth Harps in 1-3 months  Nasal saline twice daily  Massage twice daily  Flonase 2 sprays each nostril daily for 21 days  I hope you are feeling better soon! Seek care promptly with your Ear, Nose and Throat specialist if your symptoms worsen, new concerns arise or you are not improving with treatment.        Sarah Koyanagi, DO

## 2018-02-24 NOTE — Patient Instructions (Signed)
BEFORE YOU LEAVE: -follow up: schedule NPV with Dr. Ardyth Harps in 1-3 months  Nasal saline twice daily  Massage twice daily  Flonase 2 sprays each nostril daily for 21 days  I hope you are feeling better soon! Seek care promptly with your Ear, Nose and Throat specialist if your symptoms worsen, new concerns arise or you are not improving with treatment.

## 2018-03-03 DIAGNOSIS — J342 Deviated nasal septum: Secondary | ICD-10-CM | POA: Diagnosis not present

## 2018-03-03 DIAGNOSIS — R0981 Nasal congestion: Secondary | ICD-10-CM | POA: Diagnosis not present

## 2018-03-03 DIAGNOSIS — J3489 Other specified disorders of nose and nasal sinuses: Secondary | ICD-10-CM | POA: Diagnosis not present

## 2018-03-23 DIAGNOSIS — G4733 Obstructive sleep apnea (adult) (pediatric): Secondary | ICD-10-CM | POA: Diagnosis not present

## 2018-03-31 ENCOUNTER — Encounter: Payer: 59 | Admitting: Internal Medicine

## 2018-04-23 DIAGNOSIS — G4733 Obstructive sleep apnea (adult) (pediatric): Secondary | ICD-10-CM | POA: Diagnosis not present

## 2018-05-01 ENCOUNTER — Other Ambulatory Visit: Payer: Self-pay

## 2018-05-01 ENCOUNTER — Ambulatory Visit (INDEPENDENT_AMBULATORY_CARE_PROVIDER_SITE_OTHER): Payer: 59 | Admitting: Internal Medicine

## 2018-05-01 DIAGNOSIS — F331 Major depressive disorder, recurrent, moderate: Secondary | ICD-10-CM

## 2018-05-01 DIAGNOSIS — R21 Rash and other nonspecific skin eruption: Secondary | ICD-10-CM | POA: Diagnosis not present

## 2018-05-01 DIAGNOSIS — E785 Hyperlipidemia, unspecified: Secondary | ICD-10-CM

## 2018-05-01 DIAGNOSIS — E039 Hypothyroidism, unspecified: Secondary | ICD-10-CM | POA: Diagnosis not present

## 2018-05-01 DIAGNOSIS — F4323 Adjustment disorder with mixed anxiety and depressed mood: Secondary | ICD-10-CM

## 2018-05-01 DIAGNOSIS — G4733 Obstructive sleep apnea (adult) (pediatric): Secondary | ICD-10-CM

## 2018-05-01 MED ORDER — SIMVASTATIN 20 MG PO TABS: ORAL_TABLET | ORAL | 1 refills | Status: DC

## 2018-05-01 MED ORDER — CLOCORTOLONE PIVALATE 0.1 % EX CREA: 1.0000 "application " | TOPICAL_CREAM | Freq: Two times a day (BID) | CUTANEOUS | 0 refills | Status: DC

## 2018-05-01 MED ORDER — LEVOTHYROXINE SODIUM 75 MCG PO TABS: 75.0000 ug | ORAL_TABLET | Freq: Every day | ORAL | 1 refills | Status: DC

## 2018-05-01 NOTE — Progress Notes (Signed)
Virtual Visit via Video Note  I connected with Sharniece Wagner on 05/01/18 at  3:00 PM EDT by a video enabled telemedicine application and verified that I am speaking with the correct person using two identifiers.  Location patient: home Location provider: work office Persons participating in the virtual visit: patient, provider  I discussed the limitations of evaluation and management by telemedicine and the availability of in person appointments. The patient expressed understanding and agreed to proceed.   HPI: This is a scheduled visit to establish care and for follow up of chronic conditions.  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 Has a son who lives in [place]. Her daughter unfortunately passed away last year due to a drug over dose. She is seeing a psychiatrist and a counselor and has been maintained on celexa and bupropion. All things considered, she believes she does well on this regimen. She has a good support system. She and her husband attend weekly grief sessions.  She has hypothyroidism and takes 75 mcg of synthroid. Hyperlipidemia and takes Lipitor 20 mg.   She has OSA but has not been using her CPAP because she has developed nasal ulcers and chronic nasal congestion and is under the care of ENT for this, Dr. Jeral Pinch.  She has a rash on her chest. She had seen dermatology in the past for this and was given samples of cloderm with good result. She wonders if I can send in a Rx today.  She has no acute complaints.   ROS: Constitutional: Denies fever, chills, diaphoresis, appetite change and fatigue.  HEENT: Denies photophobia, eye pain, redness, hearing loss, ear pain, congestion, sore throat, rhinorrhea, sneezing, mouth sores, trouble swallowing, neck pain, neck stiffness and tinnitus.   Respiratory: Denies SOB, DOE, cough, chest tightness,  and wheezing.   Cardiovascular: Denies chest pain, palpitations and leg swelling.  Gastrointestinal: Denies nausea,  vomiting, abdominal pain, diarrhea, constipation, blood in stool and abdominal distention.  Genitourinary: Denies dysuria, urgency, frequency, hematuria, flank pain and difficulty urinating.  Endocrine: Denies: hot or cold intolerance, sweats, changes in hair or nails, polyuria, polydipsia. Musculoskeletal: Denies myalgias, back pain, joint swelling, arthralgias and gait problem.  Skin: Denies pallor, rash and wound.  Neurological: Denies dizziness, seizures, syncope, weakness, light-headedness, numbness and headaches.  Hematological: Denies adenopathy. Easy bruising, personal or family bleeding history  Psychiatric/Behavioral: Denies suicidal ideation, mood changes, confusion, nervousness, sleep disturbance and agitation   Past Medical History:  Diagnosis Date  . Allergy   . Anxiety   . Depression   . Headache(784.0)   . Hyperlipidemia   . OSA (obstructive sleep apnea) 09/10/2017  . Thyroid disease     Past Surgical History:  Procedure Laterality Date  . ABDOMINAL HYSTERECTOMY    . HERNIA REPAIR      Family History  Problem Relation Age of Onset  . Cancer Mother        ovarian  . Parkinsonism Mother   . Depression Mother   . Anxiety disorder Mother   . Cancer Father        brain  . Diabetes Father   . Crohn's disease Father   . Graves' disease Father   . Hyperlipidemia Father   . Depression Father   . Anxiety disorder Father   . Crohn's disease Sister     SOCIAL HX:   reports that she has never smoked. She has never used smokeless tobacco. She reports that she does not drink alcohol or use drugs.  Current Outpatient Medications:  .  buPROPion (WELLBUTRIN XL) 300 MG 24 hr tablet, Take 300 mg by mouth daily., Disp: , Rfl:  .  citalopram (CELEXA) 40 MG tablet, Take 1 tablet (40 mg total) by mouth daily., Disp: 30 tablet, Rfl: 2 .  Clocortolone Pivalate (CLODERM) 0.1 % cream, Apply 1 application topically 2 (two) times daily., Disp: 30 g, Rfl: 0 .  levothyroxine  (SYNTHROID) 75 MCG tablet, Take 1 tablet (75 mcg total) by mouth daily., Disp: 90 tablet, Rfl: 1 .  LORazepam (ATIVAN) 0.5 MG tablet, Take 0.5 mg by mouth as needed for anxiety., Disp: , Rfl:  .  simvastatin (ZOCOR) 20 MG tablet, TAKE 1 TABLET (20 MG TOTAL) BY MOUTH AT BEDTIME., Disp: 90 tablet, Rfl: 1 .  XIIDRA 5 % SOLN, PLACE 1 DROP INTO BOTH EYES TWICE DAILY, Disp: , Rfl: 4  EXAM:   VITALS per patient if applicable: none reported  GENERAL: alert, oriented, appears well and in no acute distress  HEENT: atraumatic, conjunttiva clear, no obvious abnormalities on inspection of external nose and ears, wears corrective lenses  NECK: normal movements of the head and neck  LUNGS: on inspection no signs of respiratory distress, breathing rate appears normal, no obvious gross increased work of breathing, gasping or wheezing  CV: no obvious cyanosis  MS: moves all visible extremities without noticeable abnormality  PSYCH/NEURO: pleasant and cooperative, no obvious depression or anxiety, speech and thought processing grossly intact  ASSESSMENT AND PLAN:   Depression, major, recurrent, moderate (HCC) Adjustment disorder with mixed anxiety and depressed mood -Sees psych and a counselor. -Continue celexa and bupropion.  Rash  -Cloderm per patient request. -Consider dermatology referral if no improvement.  Acquired hypothyroidism -Continue synthroid. -Last TSH was 0.700 in 6/19.   Dyslipidemia  -Continue lipitor. -Last LDL104 in 6/19.   OSA (obstructive sleep apnea) -Not using her CPAP due to nasal ulcers. Sees pulmonary Craige Cotta([place]) and ENT Jeral Pinch([place]).     I discussed the assessment and treatment plan with the patient. The patient was provided an opportunity to ask questions and all were answered. The patient agreed with the plan and demonstrated an understanding of the instructions.   The patient was advised to call back or seek an in-person evaluation if the symptoms worsen or if the  condition fails to improve as anticipated.    Sarah JanEstela Hernandez Acosta, MD  [facility] at [facility]

## 2018-05-23 DIAGNOSIS — G4733 Obstructive sleep apnea (adult) (pediatric): Secondary | ICD-10-CM | POA: Diagnosis not present

## 2018-05-26 DIAGNOSIS — J340 Abscess, furuncle and carbuncle of nose: Secondary | ICD-10-CM | POA: Diagnosis not present

## 2018-05-26 DIAGNOSIS — J343 Hypertrophy of nasal turbinates: Secondary | ICD-10-CM | POA: Diagnosis not present

## 2018-05-26 DIAGNOSIS — J3489 Other specified disorders of nose and nasal sinuses: Secondary | ICD-10-CM | POA: Diagnosis not present

## 2018-06-04 ENCOUNTER — Ambulatory Visit (INDEPENDENT_AMBULATORY_CARE_PROVIDER_SITE_OTHER): Payer: 59 | Admitting: Internal Medicine

## 2018-06-04 ENCOUNTER — Encounter: Payer: Self-pay | Admitting: Internal Medicine

## 2018-06-04 VITALS — BP 110/70 | HR 82 | Temp 98.6°F | Wt 154.3 lb

## 2018-06-04 DIAGNOSIS — H66002 Acute suppurative otitis media without spontaneous rupture of ear drum, left ear: Secondary | ICD-10-CM | POA: Diagnosis not present

## 2018-06-04 MED ORDER — AMOXICILLIN-POT CLAVULANATE 875-125 MG PO TABS: 1.0000 | ORAL_TABLET | Freq: Two times a day (BID) | ORAL | 0 refills | Status: AC

## 2018-06-04 NOTE — Patient Instructions (Signed)
-  Hope you feel better soon!  -Start Augmentin 1 tablet twice daily for 8 days.  -Schedule follow up if not better in 10-14 days.   Otitis Media, Adult  Otitis media means that the middle ear is red and swollen (inflamed) and full of fluid. The condition usually goes away on its own. Follow these instructions at home:  Take over-the-counter and prescription medicines only as told by your doctor.  If you were prescribed an antibiotic medicine, take it as told by your doctor. Do not stop taking the antibiotic even if you start to feel better.  Keep all follow-up visits as told by your doctor. This is important. Contact a doctor if:  You have bleeding from your nose.  There is a lump on your neck.  You are not getting better in 5 days.  You feel worse instead of better. Get help right away if:  You have pain that is not helped with medicine.  You have swelling, redness, or pain around your ear.  You get a stiff neck.  You cannot move part of your face (paralyzed).  You notice that the bone behind your ear hurts when you touch it.  You get a very bad headache. Summary  Otitis media means that the middle ear is red, swollen, and full of fluid.  This condition usually goes away on its own. In some cases, treatment may be needed.  If you were prescribed an antibiotic medicine, take it as told by your doctor. This information is not intended to replace advice given to you by your health care provider. Make sure you discuss any questions you have with your health care provider. Document Released: 06/19/2007 Document Revised: 01/22/2016 Document Reviewed: 01/22/2016 Elsevier Interactive Patient Education  2019 [employer].

## 2018-06-04 NOTE — Progress Notes (Signed)
Acute Office Visit     CC/Reason for Visit: left ear pain  HPI: Sarah Wagner is a 58 y.o. female who is coming in today for the above mentioned reasons. For 2-3 weeks she has been having pain of her left ear, with muffled sounds and a ringing sensation. She follows regularly with ENT, Dr. Jeral Pinchowns due to a h/o nasal polyps and was told a few months ago that she had fluid in her ear that would get better on its own. No fever, no teeth pain, no recent URI symptoms   Past Medical/Surgical History: Past Medical History:  Diagnosis Date  . Allergy   . Anxiety   . Depression   . Headache(784.0)   . Hyperlipidemia   . OSA (obstructive sleep apnea) 09/10/2017  . Thyroid disease     Past Surgical History:  Procedure Laterality Date  . ABDOMINAL HYSTERECTOMY    . HERNIA REPAIR      Social History:  reports that she has never smoked. She has never used smokeless tobacco. She reports that she does not drink alcohol or use drugs.  Allergies: Allergies  Allergen Reactions  . Sulfacetamide Sodium     Family History:  Family History  Problem Relation Age of Onset  . Cancer Mother        ovarian  . Parkinsonism Mother   . Depression Mother   . Anxiety disorder Mother   . Cancer Father        brain  . Diabetes Father   . Crohn's disease Father   . Graves' disease Father   . Hyperlipidemia Father   . Depression Father   . Anxiety disorder Father   . Crohn's disease Sister      Current Outpatient Medications:  .  buPROPion (WELLBUTRIN XL) 300 MG 24 hr tablet, Take 300 mg by mouth daily., Disp: , Rfl:  .  Clocortolone Pivalate (CLODERM) 0.1 % cream, Apply 1 application topically 2 (two) times daily., Disp: 30 g, Rfl: 0 .  levothyroxine (SYNTHROID) 75 MCG tablet, Take 1 tablet (75 mcg total) by mouth daily., Disp: 90 tablet, Rfl: 1 .  LORazepam (ATIVAN) 0.5 MG tablet, Take 0.5 mg by mouth as needed for anxiety., Disp: , Rfl:  .  simvastatin (ZOCOR) 20 MG tablet, TAKE 1  TABLET (20 MG TOTAL) BY MOUTH AT BEDTIME., Disp: 90 tablet, Rfl: 1 .  XIIDRA 5 % SOLN, PLACE 1 DROP INTO BOTH EYES TWICE DAILY, Disp: , Rfl: 4 .  amoxicillin-clavulanate (AUGMENTIN) 875-125 MG tablet, Take 1 tablet by mouth 2 (two) times daily for 8 days., Disp: 16 tablet, Rfl: 0 .  citalopram (CELEXA) 40 MG tablet, Take 1 tablet (40 mg total) by mouth daily., Disp: 30 tablet, Rfl: 2  Review of Systems:  Constitutional: Denies fever, chills, diaphoresis, appetite change and fatigue.  HEENT: Denies photophobia, eye pain, redness, hearing loss, ear pain, congestion, sore throat, rhinorrhea, sneezing, mouth sores, trouble swallowing, neck pain, neck stiffness and tinnitus.   Respiratory: Denies SOB, DOE, cough, chest tightness,  and wheezing.   Cardiovascular: Denies chest pain, palpitations and leg swelling.  Gastrointestinal: Denies nausea, vomiting, abdominal pain, diarrhea, constipation, blood in stool and abdominal distention.  Genitourinary: Denies dysuria, urgency, frequency, hematuria, flank pain and difficulty urinating.  Endocrine: Denies: hot or cold intolerance, sweats, changes in hair or nails, polyuria, polydipsia. Musculoskeletal: Denies myalgias, back pain, joint swelling, arthralgias and gait problem.  Skin: Denies pallor, rash and wound.  Neurological: Denies dizziness, seizures, syncope, weakness,  light-headedness, numbness and headaches.  Hematological: Denies adenopathy. Easy bruising, personal or family bleeding history  Psychiatric/Behavioral: Denies suicidal ideation, mood changes, confusion, nervousness, sleep disturbance and agitation    Physical Exam: Vitals:   06/04/18 1513  BP: 110/70  Pulse: 82  Temp: 98.6 F (37 C)  TempSrc: Oral  SpO2: 97%  Weight: 154 lb 4.8 oz (70 kg)    Body mass index is 29.15 kg/m.   Constitutional: NAD, calm, comfortable Eyes: PERRL, lids and conjunctivae normal ENMT: Mucous membranes are moist. Posterior pharynx clear of any  exudate or lesions. Normal dentition. Tympanic membrane is pearly white, no erythema or bulging on the right. Left is erythematous with bulging and an air-fluid level. Psychiatric: Normal judgment and insight. Alert and oriented x 3. Normal mood.    Impression and Plan:  Acute suppurative otitis media of left ear without spontaneous rupture of tympanic membrane, recurrence not specified -Augmentin BID for 8 days. -RTC if no improvement in 10-14 days.   Patient Instructions  -Hope you feel better soon!  -Start Augmentin 1 tablet twice daily for 8 days.  -Schedule follow up if not better in 10-14 days.   Otitis Media, Adult  Otitis media means that the middle ear is red and swollen (inflamed) and full of fluid. The condition usually goes away on its own. Follow these instructions at home:  Take over-the-counter and prescription medicines only as told by your doctor.  If you were prescribed an antibiotic medicine, take it as told by your doctor. Do not stop taking the antibiotic even if you start to feel better.  Keep all follow-up visits as told by your doctor. This is important. Contact a doctor if:  You have bleeding from your nose.  There is a lump on your neck.  You are not getting better in 5 days.  You feel worse instead of better. Get help right away if:  You have pain that is not helped with medicine.  You have swelling, redness, or pain around your ear.  You get a stiff neck.  You cannot move part of your face (paralyzed).  You notice that the bone behind your ear hurts when you touch it.  You get a very bad headache. Summary  Otitis media means that the middle ear is red, swollen, and full of fluid.  This condition usually goes away on its own. In some cases, treatment may be needed.  If you were prescribed an antibiotic medicine, take it as told by your doctor. This information is not intended to replace advice given to you by your health care  provider. Make sure you discuss any questions you have with your health care provider. Document Released: 06/19/2007 Document Revised: 01/22/2016 Document Reviewed: 01/22/2016 Elsevier Interactive Patient Education  2019 Else[employer] Inc.      Chaya Jan, MD [facility] at [facility]

## 2018-06-17 ENCOUNTER — Telehealth: Payer: Self-pay | Admitting: Internal Medicine

## 2018-06-17 NOTE — Telephone Encounter (Signed)
She should probably follow up with her ENT Dr. Jeral Pinch, given her history if she did not improve after a course of abx.

## 2018-06-17 NOTE — Telephone Encounter (Signed)
Copied from CRM [id number]. Topic: Quick Communication - See Telephone Encounter >> Jun 17, 2018 11:50 AM Sarah Wagner wrote: CRM for notification. See Telephone encounter for: 06/17/18. Pt called and stated that she was given an antibiotic for an ear issue. Pt states that she ha taken all the antibiotic and the ear is still not better. Pt would like a call back regarding. Please advise

## 2018-06-18 NOTE — Telephone Encounter (Signed)
Pt called stating she was not feeling better after antibiotic. I relayed message PCP left and pt stated understanding.

## 2018-07-29 ENCOUNTER — Other Ambulatory Visit: Payer: Self-pay

## 2018-07-29 ENCOUNTER — Encounter: Payer: Self-pay | Admitting: Internal Medicine

## 2018-07-29 ENCOUNTER — Other Ambulatory Visit: Payer: Self-pay | Admitting: Internal Medicine

## 2018-07-29 ENCOUNTER — Ambulatory Visit (INDEPENDENT_AMBULATORY_CARE_PROVIDER_SITE_OTHER): Payer: 59 | Admitting: Internal Medicine

## 2018-07-29 VITALS — BP 120/84 | HR 83 | Temp 97.1°F | Ht 60.5 in | Wt 154.0 lb

## 2018-07-29 DIAGNOSIS — Z1231 Encounter for screening mammogram for malignant neoplasm of breast: Secondary | ICD-10-CM | POA: Diagnosis not present

## 2018-07-29 DIAGNOSIS — E559 Vitamin D deficiency, unspecified: Secondary | ICD-10-CM | POA: Insufficient documentation

## 2018-07-29 DIAGNOSIS — E039 Hypothyroidism, unspecified: Secondary | ICD-10-CM | POA: Diagnosis not present

## 2018-07-29 DIAGNOSIS — F4323 Adjustment disorder with mixed anxiety and depressed mood: Secondary | ICD-10-CM

## 2018-07-29 DIAGNOSIS — Z23 Encounter for immunization: Secondary | ICD-10-CM

## 2018-07-29 DIAGNOSIS — E538 Deficiency of other specified B group vitamins: Secondary | ICD-10-CM | POA: Insufficient documentation

## 2018-07-29 DIAGNOSIS — B354 Tinea corporis: Secondary | ICD-10-CM | POA: Diagnosis not present

## 2018-07-29 DIAGNOSIS — E785 Hyperlipidemia, unspecified: Secondary | ICD-10-CM

## 2018-07-29 DIAGNOSIS — Z0000 Encounter for general adult medical examination without abnormal findings: Secondary | ICD-10-CM

## 2018-07-29 DIAGNOSIS — J309 Allergic rhinitis, unspecified: Secondary | ICD-10-CM

## 2018-07-29 DIAGNOSIS — E669 Obesity, unspecified: Secondary | ICD-10-CM

## 2018-07-29 DIAGNOSIS — G4733 Obstructive sleep apnea (adult) (pediatric): Secondary | ICD-10-CM

## 2018-07-29 LAB — CBC WITH DIFFERENTIAL/PLATELET
Basophils Absolute: 0 10*3/uL (ref 0.0–0.1)
Basophils Relative: 0.6 % (ref 0.0–3.0)
Eosinophils Absolute: 0.2 10*3/uL (ref 0.0–0.7)
Eosinophils Relative: 2.1 % (ref 0.0–5.0)
HCT: 41.3 % (ref 36.0–46.0)
Hemoglobin: 13.4 g/dL (ref 12.0–15.0)
Lymphocytes Relative: 26.3 % (ref 12.0–46.0)
Lymphs Abs: 2 10*3/uL (ref 0.7–4.0)
MCHC: 32.4 g/dL (ref 30.0–36.0)
MCV: 87.1 fl (ref 78.0–100.0)
Monocytes Absolute: 0.6 10*3/uL (ref 0.1–1.0)
Monocytes Relative: 7.2 % (ref 3.0–12.0)
Neutro Abs: 4.9 10*3/uL (ref 1.4–7.7)
Neutrophils Relative %: 63.8 % (ref 43.0–77.0)
Platelets: 307 10*3/uL (ref 150.0–400.0)
RBC: 4.74 Mil/uL (ref 3.87–5.11)
RDW: 13.8 % (ref 11.5–15.5)
WBC: 7.7 10*3/uL (ref 4.0–10.5)

## 2018-07-29 LAB — COMPREHENSIVE METABOLIC PANEL
ALT: 9 U/L (ref 0–35)
AST: 10 U/L (ref 0–37)
Albumin: 4.3 g/dL (ref 3.5–5.2)
Alkaline Phosphatase: 72 U/L (ref 39–117)
BUN: 14 mg/dL (ref 6–23)
CO2: 30 mEq/L (ref 19–32)
Calcium: 9.2 mg/dL (ref 8.4–10.5)
Chloride: 104 mEq/L (ref 96–112)
Creatinine, Ser: 0.84 mg/dL (ref 0.40–1.20)
GFR: 69.29 mL/min (ref >60.00)
Glucose, Bld: 103 mg/dL — ABNORMAL HIGH (ref 70–99)
Potassium: 4.5 mEq/L (ref 3.5–5.1)
Sodium: 141 mEq/L (ref 135–145)
Total Bilirubin: 0.4 mg/dL (ref 0.2–1.2)
Total Protein: 6.9 g/dL (ref 6.0–8.3)

## 2018-07-29 LAB — LIPID PANEL
Cholesterol: 177 mg/dL (ref 0–200)
HDL: 44 mg/dL (ref >39.00)
LDL Cholesterol: 102 mg/dL — ABNORMAL HIGH (ref 0–99)
NonHDL: 132.72
Total CHOL/HDL Ratio: 4
Triglycerides: 154 mg/dL — ABNORMAL HIGH (ref 0.0–149.0)
VLDL: 30.8 mg/dL (ref 0.0–40.0)

## 2018-07-29 LAB — TSH: TSH: 0.51 u[IU]/mL (ref 0.35–4.50)

## 2018-07-29 LAB — VITAMIN D 25 HYDROXY (VIT D DEFICIENCY, FRACTURES): VITD: 27.09 ng/mL — ABNORMAL LOW (ref 30.00–100.00)

## 2018-07-29 LAB — HEMOGLOBIN A1C: Hgb A1c MFr Bld: 6 % (ref 4.6–6.5)

## 2018-07-29 LAB — VITAMIN B12: Vitamin B-12: 153 pg/mL — ABNORMAL LOW (ref 211–911)

## 2018-07-29 MED ORDER — VITAMIN D (ERGOCALCIFEROL) 1.25 MG (50000 UNIT) PO CAPS: 50000.0000 [IU] | ORAL_CAPSULE | ORAL | 0 refills | Status: AC

## 2018-07-29 NOTE — Patient Instructions (Signed)
-Nice seeing you today!  -Lab work today; will notify you once results are available.  -Mammogram requested today.  -1 of 2 shingles vaccines today. Second before 6 months have passed.  -Make sure you schedule your dental appointment.  -Inquire with GYN necessity of continuing PAP smears.   Preventive Care 26-59 Years Old, Female Preventive care refers to visits with your health care provider and lifestyle choices that can promote health and wellness. This includes:  A yearly physical exam. This may also be called an annual well check.  Regular dental visits and eye exams.  Immunizations.  Screening for certain conditions.  Healthy lifestyle choices, such as eating a healthy diet, getting regular exercise, not using drugs or products that contain nicotine and tobacco, and limiting alcohol use. What can I expect for my preventive care visit? Physical exam Your health care provider will check your:  Height and weight. This may be used to calculate body mass index (BMI), which tells if you are at a healthy weight.  Heart rate and blood pressure.  Skin for abnormal spots. Counseling Your health care provider may ask you questions about your:  Alcohol, tobacco, and drug use.  Emotional well-being.  Home and relationship well-being.  Sexual activity.  Eating habits.  Work and work Statistician.  Method of birth control.  Menstrual cycle.  Pregnancy history. What immunizations do I need?  Influenza (flu) vaccine  This is recommended every year. Tetanus, diphtheria, and pertussis (Tdap) vaccine  You may need a Td booster every 10 years. Varicella (chickenpox) vaccine  You may need this if you have not been vaccinated. Zoster (shingles) vaccine  You may need this after age 39. Measles, mumps, and rubella (MMR) vaccine  You may need at least one dose of MMR if you were born in 1957 or later. You may also need a second dose. Pneumococcal conjugate (PCV13)  vaccine  You may need this if you have certain conditions and were not previously vaccinated. Pneumococcal polysaccharide (PPSV23) vaccine  You may need one or two doses if you smoke cigarettes or if you have certain conditions. Meningococcal conjugate (MenACWY) vaccine  You may need this if you have certain conditions. Hepatitis A vaccine  You may need this if you have certain conditions or if you travel or work in places where you may be exposed to hepatitis A. Hepatitis B vaccine  You may need this if you have certain conditions or if you travel or work in places where you may be exposed to hepatitis B. Haemophilus influenzae type b (Hib) vaccine  You may need this if you have certain conditions. Human papillomavirus (HPV) vaccine  If recommended by your health care provider, you may need three doses over 6 months. You may receive vaccines as individual doses or as more than one vaccine together in one shot (combination vaccines). Talk with your health care provider about the risks and benefits of combination vaccines. What tests do I need? Blood tests  Lipid and cholesterol levels. These may be checked every 5 years, or more frequently if you are over 31 years old.  Hepatitis C test.  Hepatitis B test. Screening  Lung cancer screening. You may have this screening every year starting at age 66 if you have a 30-pack-year history of smoking and currently smoke or have quit within the past 15 years.  Colorectal cancer screening. All adults should have this screening starting at age 2 and continuing until age 55. Your health care provider may recommend screening  at age 61 if you are at increased risk. You will have tests every 1-10 years, depending on your results and the type of screening test.  Diabetes screening. This is done by checking your blood sugar (glucose) after you have not eaten for a while (fasting). You may have this done every 1-3 years.  Mammogram. This may be  done every 1-2 years. Talk with your health care provider about when you should start having regular mammograms. This may depend on whether you have a family history of breast cancer.  BRCA-related cancer screening. This may be done if you have a family history of breast, ovarian, tubal, or peritoneal cancers.  Pelvic exam and Pap test. This may be done every 3 years starting at age 108. Starting at age 14, this may be done every 5 years if you have a Pap test in combination with an HPV test. Other tests  Sexually transmitted disease (STD) testing.  Bone density scan. This is done to screen for osteoporosis. You may have this scan if you are at high risk for osteoporosis. Follow these instructions at home: Eating and drinking  Eat a diet that includes fresh fruits and vegetables, whole grains, lean protein, and low-fat dairy.  Take vitamin and mineral supplements as recommended by your health care provider.  Do not drink alcohol if: ? Your health care provider tells you not to drink. ? You are pregnant, may be pregnant, or are planning to become pregnant.  If you drink alcohol: ? Limit how much you have to 0-1 drink a day. ? Be aware of how much alcohol is in your drink. In the [place] one drink equals one 12 oz bottle of beer (355 mL), one 5 oz glass of wine (148 mL), or one 1 oz glass of hard liquor (44 mL). Lifestyle  Take daily care of your teeth and gums.  Stay active. Exercise for at least 30 minutes on 5 or more days each week.  Do not use any products that contain nicotine or tobacco, such as cigarettes, e-cigarettes, and chewing tobacco. If you need help quitting, ask your health care provider.  If you are sexually active, practice safe sex. Use a condom or other form of birth control (contraception) in order to prevent pregnancy and STIs (sexually transmitted infections).  If told by your health care provider, take low-dose aspirin daily starting at age 12. What's next?   Visit your health care provider once a year for a well check visit.  Ask your health care provider how often you should have your eyes and teeth checked.  Stay up to date on all vaccines. This information is not intended to replace advice given to you by your health care provider. Make sure you discuss any questions you have with your health care provider. Document Released: 01/27/2015 Document Revised: 09/11/2017 Document Reviewed: 09/11/2017 Elsevier Patient Education  2020 [employer].

## 2018-07-29 NOTE — Progress Notes (Signed)
Established Patient Office Visit     CC/Reason for Visit: Annual preventive exam, discuss some acute issues  HPI: Sarah Wagner is a 58 y.o. female who is coming in today for the above mentioned reasons. Past Medical History is significant for: Adjustment disorder/depression following the death of her daughter last year due to a drug overdose.  She is currently seeing a psychiatrist and a counselor and is on Celexa and bupropion.  History of hypothyroidism that has been well controlled on 75 mcg of levothyroxine, she also has hyperlipidemia and is on atorvastatin.  She also has a significant history of nasal ulcers, polyps and chronic nasal congestions and is under the care of ENT, Dr. Cherlyn Cushing.  She has a history of obstructive sleep apnea but has not been using her CPAP regularly due to her nasal ulcer issues.  She feels like she is not sleeping well.  She would like to discuss a lesion on her outer left thigh that is small, round and scaly in appearance and has been present for about 2 weeks.  She has had the same thing in other areas that seem to resolve spontaneously.  She has routine eye care, she is due for mammogram today.   Past Medical/Surgical History: Past Medical History:  Diagnosis Date  . Allergy   . Anxiety   . Depression   . Headache(784.0)   . Hyperlipidemia   . OSA (obstructive sleep apnea) 09/10/2017  . Thyroid disease     Past Surgical History:  Procedure Laterality Date  . ABDOMINAL HYSTERECTOMY    . HERNIA REPAIR      Social History:  reports that she has never smoked. She has never used smokeless tobacco. She reports that she does not drink alcohol or use drugs.  Allergies: Allergies  Allergen Reactions  . Sulfacetamide Sodium     Family History:  Family History  Problem Relation Age of Onset  . Cancer Mother        ovarian  . Parkinsonism Mother   . Depression Mother   . Anxiety disorder Mother   . Cancer Father        brain  . Diabetes  Father   . Crohn's disease Father   . Graves' disease Father   . Hyperlipidemia Father   . Depression Father   . Anxiety disorder Father   . Crohn's disease Sister      Current Outpatient Medications:  .  buPROPion (WELLBUTRIN XL) 300 MG 24 hr tablet, Take 300 mg by mouth daily., Disp: , Rfl:  .  citalopram (CELEXA) 40 MG tablet, Take 1 tablet (40 mg total) by mouth daily., Disp: 30 tablet, Rfl: 2 .  Clocortolone Pivalate (CLODERM) 0.1 % cream, Apply 1 application topically 2 (two) times daily., Disp: 30 g, Rfl: 0 .  levothyroxine (SYNTHROID) 75 MCG tablet, Take 1 tablet (75 mcg total) by mouth daily., Disp: 90 tablet, Rfl: 1 .  simvastatin (ZOCOR) 20 MG tablet, TAKE 1 TABLET (20 MG TOTAL) BY MOUTH AT BEDTIME., Disp: 90 tablet, Rfl: 1 .  LORazepam (ATIVAN) 0.5 MG tablet, Take 0.5 mg by mouth as needed for anxiety., Disp: , Rfl:  .  XIIDRA 5 % SOLN, PLACE 1 DROP INTO BOTH EYES TWICE DAILY, Disp: , Rfl: 4  Review of Systems:  Constitutional: Denies fever, chills, diaphoresis, appetite change and fatigue.  HEENT: Denies photophobia, eye pain, redness, hearing loss, ear pain, congestion, sore throat, rhinorrhea, sneezing, mouth sores, trouble swallowing, neck pain, neck stiffness and  tinnitus.   Respiratory: Denies SOB, DOE, cough, chest tightness,  and wheezing.   Cardiovascular: Denies chest pain, palpitations and leg swelling.  Gastrointestinal: Denies nausea, vomiting, abdominal pain, diarrhea, constipation, blood in stool and abdominal distention.  Genitourinary: Denies dysuria, urgency, frequency, hematuria, flank pain and difficulty urinating.  Endocrine: Denies: hot or cold intolerance, sweats, changes in hair or nails, polyuria, polydipsia. Musculoskeletal: Denies myalgias, back pain, joint swelling, arthralgias and gait problem.  Skin: Denies pallor, rash and wound.  Neurological: Denies dizziness, seizures, syncope, weakness, light-headedness, numbness and headaches.   Hematological: Denies adenopathy. Easy bruising, personal or family bleeding history  Psychiatric/Behavioral: Denies suicidal ideation, mood changes, confusion, nervousness, sleep disturbance and agitation    Physical Exam: Vitals:   07/29/18 0658  BP: 120/84  Pulse: 83  Temp: (!) 97.1 F (36.2 C)  TempSrc: Oral  SpO2: 95%  Weight: 154 lb (69.9 kg)  Height: 5' 0.5" (1.537 m)    Body mass index is 29.58 kg/m.   Constitutional: NAD, calm, comfortable Eyes: PERRL, lids and conjunctivae normal, wears corrective lenses ENMT: Mucous membranes are moist. Posterior pharynx clear of any exudate or lesions. Normal dentition. Tympanic membrane is pearly white, no erythema or bulging. Neck: normal, supple, no masses, no thyromegaly Respiratory: clear to auscultation bilaterally, no wheezing, no crackles. Normal respiratory effort. No accessory muscle use.  Cardiovascular: Regular rate and rhythm, no murmurs / rubs / gallops. No extremity edema. 2+ pedal pulses. No carotid bruits.  Abdomen: no tenderness, no masses palpated. No hepatosplenomegaly. Bowel sounds positive.  Musculoskeletal: no clubbing / cyanosis. No joint deformity upper and lower extremities. Good ROM, no contractures. Normal muscle tone.  Skin: Small, annular, scaly lesion on her outer left thigh. Neurologic: CN 2-12 grossly intact. Sensation intact, DTR normal. Strength 5/5 in all 4.  Psychiatric: Normal judgment and insight. Alert and oriented x 3. Normal mood.    Impression and Plan:  Encounter for preventive health examination -Have advised routine eye and dental care. -She will receive her first shingles immunization today, knows to return within 6 months for second, otherwise immunizations are up-to-date and age-appropriate. -She is due for colonoscopy next year, had initial colonoscopy at age 48 and was a 10-year callback. -She is due for screening mammogram, was last done in 2012, will order today. -She states  she had a hysterectomy and her GYN has not recommended repeat Pap smears.  She will follow-up with GYN for routine cervical cancer screening. -Screening labs to be done today. -We have discussed healthy lifestyle in detail today.  Ringworm of body -I believe the lesion on her left outer thigh is ringworm. -Advised to use OTC terbinafine cream and contact us if no improvement.  Acquired hypothyroidism  -Check TSH today. -Continue current dose of Synthroid.  Dyslipidemia  -Last LDL was 104 in June 2019. -Continue Lipitor and recheck lipids today.  Adjustment disorder with mixed anxiety and depressed mood -Continue Wellbutrin, follow-up with psychiatry and counselor.  OSA (obstructive sleep apnea)  Allergic rhinitis, unspecified seasonality, unspecified trigger -Will follow-up with ENT next month.  Obesity (BMI 30-39.9) -Discussed healthy lifestyle, including increased physical activity and better food choices to promote weight loss.    Patient Instructions  -Nice seeing you today!  -Lab work today; will notify you once results are available.  -Mammogram requested today.  -1 of 2 shingles vaccines today. Second before 6 months have passed.  -Make sure you schedule your dental appointment.  -Inquire with GYN necessity of continuing PAP smears.  Preventive Care 40-67 Years Old, Female Preventive care refers to visits with your health care provider and lifestyle choices that can promote health and wellness. This includes:  A yearly physical exam. This may also be called an annual well check.  Regular dental visits and eye exams.  Immunizations.  Screening for certain conditions.  Healthy lifestyle choices, such as eating a healthy diet, getting regular exercise, not using drugs or products that contain nicotine and tobacco, and limiting alcohol use. What can I expect for my preventive care visit? Physical exam Your health care provider will check your:  Height  and weight. This may be used to calculate body mass index (BMI), which tells if you are at a healthy weight.  Heart rate and blood pressure.  Skin for abnormal spots. Counseling Your health care provider may ask you questions about your:  Alcohol, tobacco, and drug use.  Emotional well-being.  Home and relationship well-being.  Sexual activity.  Eating habits.  Work and work Statistician.  Method of birth control.  Menstrual cycle.  Pregnancy history. What immunizations do I need?  Influenza (flu) vaccine  This is recommended every year. Tetanus, diphtheria, and pertussis (Tdap) vaccine  You may need a Td booster every 10 years. Varicella (chickenpox) vaccine  You may need this if you have not been vaccinated. Zoster (shingles) vaccine  You may need this after age 32. Measles, mumps, and rubella (MMR) vaccine  You may need at least one dose of MMR if you were born in 1957 or later. You may also need a second dose. Pneumococcal conjugate (PCV13) vaccine  You may need this if you have certain conditions and were not previously vaccinated. Pneumococcal polysaccharide (PPSV23) vaccine  You may need one or two doses if you smoke cigarettes or if you have certain conditions. Meningococcal conjugate (MenACWY) vaccine  You may need this if you have certain conditions. Hepatitis A vaccine  You may need this if you have certain conditions or if you travel or work in places where you may be exposed to hepatitis A. Hepatitis B vaccine  You may need this if you have certain conditions or if you travel or work in places where you may be exposed to hepatitis B. Haemophilus influenzae type b (Hib) vaccine  You may need this if you have certain conditions. Human papillomavirus (HPV) vaccine  If recommended by your health care provider, you may need three doses over 6 months. You may receive vaccines as individual doses or as more than one vaccine together in one shot  (combination vaccines). Talk with your health care provider about the risks and benefits of combination vaccines. What tests do I need? Blood tests  Lipid and cholesterol levels. These may be checked every 5 years, or more frequently if you are over 40 years old.  Hepatitis C test.  Hepatitis B test. Screening  Lung cancer screening. You may have this screening every year starting at age 60 if you have a 30-pack-year history of smoking and currently smoke or have quit within the past 15 years.  Colorectal cancer screening. All adults should have this screening starting at age 11 and continuing until age 56. Your health care provider may recommend screening at age 90 if you are at increased risk. You will have tests every 1-10 years, depending on your results and the type of screening test.  Diabetes screening. This is done by checking your blood sugar (glucose) after you have not eaten for a while (fasting). You may have  this done every 1-3 years.  Mammogram. This may be done every 1-2 years. Talk with your health care provider about when you should start having regular mammograms. This may depend on whether you have a family history of breast cancer.  BRCA-related cancer screening. This may be done if you have a family history of breast, ovarian, tubal, or peritoneal cancers.  Pelvic exam and Pap test. This may be done every 3 years starting at age 65. Starting at age 35, this may be done every 5 years if you have a Pap test in combination with an HPV test. Other tests  Sexually transmitted disease (STD) testing.  Bone density scan. This is done to screen for osteoporosis. You may have this scan if you are at high risk for osteoporosis. Follow these instructions at home: Eating and drinking  Eat a diet that includes fresh fruits and vegetables, whole grains, lean protein, and low-fat dairy.  Take vitamin and mineral supplements as recommended by your health care provider.  Do not  drink alcohol if: ? Your health care provider tells you not to drink. ? You are pregnant, may be pregnant, or are planning to become pregnant.  If you drink alcohol: ? Limit how much you have to 0-1 drink a day. ? Be aware of how much alcohol is in your drink. In the [place] one drink equals one 12 oz bottle of beer (355 mL), one 5 oz glass of wine (148 mL), or one 1 oz glass of hard liquor (44 mL). Lifestyle  Take daily care of your teeth and gums.  Stay active. Exercise for at least 30 minutes on 5 or more days each week.  Do not use any products that contain nicotine or tobacco, such as cigarettes, e-cigarettes, and chewing tobacco. If you need help quitting, ask your health care provider.  If you are sexually active, practice safe sex. Use a condom or other form of birth control (contraception) in order to prevent pregnancy and STIs (sexually transmitted infections).  If told by your health care provider, take low-dose aspirin daily starting at age 19. What's next?  Visit your health care provider once a year for a well check visit.  Ask your health care provider how often you should have your eyes and teeth checked.  Stay up to date on all vaccines. This information is not intended to replace advice given to you by your health care provider. Make sure you discuss any questions you have with your health care provider. Document Released: 01/27/2015 Document Revised: 09/11/2017 Document Reviewed: 09/11/2017 Elsevier Patient Education  2020 [place], MD [facility]

## 2018-07-30 ENCOUNTER — Ambulatory Visit: Payer: Self-pay

## 2018-07-30 NOTE — Telephone Encounter (Signed)
Incoming call from [employer].  Provided lab results of Dr. Alden Hipp of 07/29/18.  Patient voiced understanding.  Will come in for injection.  Transferred call to [place] office.

## 2018-08-03 ENCOUNTER — Other Ambulatory Visit: Payer: Self-pay

## 2018-08-03 ENCOUNTER — Ambulatory Visit (INDEPENDENT_AMBULATORY_CARE_PROVIDER_SITE_OTHER): Payer: 59 | Admitting: *Deleted

## 2018-08-03 DIAGNOSIS — E538 Deficiency of other specified B group vitamins: Secondary | ICD-10-CM

## 2018-08-03 DIAGNOSIS — E559 Vitamin D deficiency, unspecified: Secondary | ICD-10-CM

## 2018-08-03 MED ORDER — CYANOCOBALAMIN 1000 MCG/ML IJ SOLN
1000.0000 ug | Freq: Once | INTRAMUSCULAR | Status: AC
  Administered 2018-08-03: 11:00:00 1000 ug via INTRAMUSCULAR

## 2018-08-03 NOTE — Progress Notes (Signed)
Per orders of Dr. Hernandez, injection of B12 given by QuaNeisha S Jones. Patient tolerated injection well. 

## 2018-08-10 ENCOUNTER — Other Ambulatory Visit: Payer: Self-pay

## 2018-08-10 ENCOUNTER — Ambulatory Visit (INDEPENDENT_AMBULATORY_CARE_PROVIDER_SITE_OTHER): Payer: 59 | Admitting: *Deleted

## 2018-08-10 DIAGNOSIS — E538 Deficiency of other specified B group vitamins: Secondary | ICD-10-CM

## 2018-08-10 MED ORDER — CYANOCOBALAMIN 1000 MCG/ML IJ SOLN
1000.0000 ug | Freq: Once | INTRAMUSCULAR | Status: AC
  Administered 2018-08-10: 10:00:00 1000 ug via INTRAMUSCULAR

## 2018-08-10 NOTE — Progress Notes (Signed)
Patient in clinic for B-12 injection. B-12 administered in clinic with no reactions

## 2018-08-17 ENCOUNTER — Ambulatory Visit (INDEPENDENT_AMBULATORY_CARE_PROVIDER_SITE_OTHER): Payer: 59 | Admitting: *Deleted

## 2018-08-17 ENCOUNTER — Other Ambulatory Visit: Payer: Self-pay

## 2018-08-17 DIAGNOSIS — E538 Deficiency of other specified B group vitamins: Secondary | ICD-10-CM | POA: Diagnosis not present

## 2018-08-17 MED ORDER — CYANOCOBALAMIN 1000 MCG/ML IJ SOLN
1000.0000 ug | Freq: Once | INTRAMUSCULAR | Status: AC
  Administered 2018-08-17: 1000 ug via INTRAMUSCULAR

## 2018-08-17 NOTE — Progress Notes (Signed)
Per orders of Dr. Burchette, injection of Cyanocobalamin 1000mcg given by Levon Boettcher A. Patient tolerated injection well.  

## 2018-08-26 ENCOUNTER — Ambulatory Visit (INDEPENDENT_AMBULATORY_CARE_PROVIDER_SITE_OTHER): Payer: 59 | Admitting: *Deleted

## 2018-08-26 ENCOUNTER — Other Ambulatory Visit: Payer: Self-pay

## 2018-08-26 DIAGNOSIS — E538 Deficiency of other specified B group vitamins: Secondary | ICD-10-CM

## 2018-08-26 MED ORDER — CYANOCOBALAMIN 1000 MCG/ML IJ SOLN
1000.0000 ug | Freq: Once | INTRAMUSCULAR | Status: AC
  Administered 2018-08-26: 1000 ug via INTRAMUSCULAR

## 2018-08-26 NOTE — Progress Notes (Signed)
Per orders of Dr. Hernandez, injection of Cyanocobalamin 1000mcg given by Funderburk, Jo A. Patient tolerated injection well. 

## 2018-08-31 ENCOUNTER — Ambulatory Visit (INDEPENDENT_AMBULATORY_CARE_PROVIDER_SITE_OTHER): Payer: 59 | Admitting: *Deleted

## 2018-08-31 ENCOUNTER — Other Ambulatory Visit: Payer: Self-pay

## 2018-08-31 DIAGNOSIS — E538 Deficiency of other specified B group vitamins: Secondary | ICD-10-CM

## 2018-08-31 MED ORDER — CYANOCOBALAMIN 1000 MCG/ML IJ SOLN
1000.0000 ug | Freq: Once | INTRAMUSCULAR | Status: AC
  Administered 2018-08-31: 1000 ug via INTRAMUSCULAR

## 2018-08-31 NOTE — Progress Notes (Signed)
Patient in for B-12 injection. Injection administered with no adverse reaction.

## 2018-09-15 ENCOUNTER — Ambulatory Visit: Payer: 59

## 2018-10-01 ENCOUNTER — Ambulatory Visit (INDEPENDENT_AMBULATORY_CARE_PROVIDER_SITE_OTHER): Payer: 59

## 2018-10-01 ENCOUNTER — Other Ambulatory Visit: Payer: Self-pay

## 2018-10-01 DIAGNOSIS — Z23 Encounter for immunization: Secondary | ICD-10-CM

## 2018-10-01 DIAGNOSIS — E538 Deficiency of other specified B group vitamins: Secondary | ICD-10-CM | POA: Diagnosis not present

## 2018-10-01 MED ORDER — CYANOCOBALAMIN 1000 MCG/ML IJ SOLN
1000.0000 ug | Freq: Once | INTRAMUSCULAR | Status: AC
  Administered 2018-10-01: 1000 ug via INTRAMUSCULAR

## 2018-10-01 NOTE — Progress Notes (Signed)
Per orders of Dr. Isaac Bliss, injection of Cyanocobalamin 1000 mcg/mL given by Wyvonne Lenz. Patient tolerated injection well.

## 2018-10-23 ENCOUNTER — Ambulatory Visit
Admission: RE | Admit: 2018-10-23 | Discharge: 2018-10-23 | Disposition: A | Discharge status: Home / Self Care | Payer: 59 | Source: Ambulatory Visit | Attending: Internal Medicine | Admitting: Internal Medicine

## 2018-10-23 ENCOUNTER — Other Ambulatory Visit: Payer: Self-pay

## 2018-10-23 DIAGNOSIS — Z1231 Encounter for screening mammogram for malignant neoplasm of breast: Secondary | ICD-10-CM

## 2018-11-09 ENCOUNTER — Other Ambulatory Visit: Payer: Self-pay

## 2018-11-09 ENCOUNTER — Ambulatory Visit (INDEPENDENT_AMBULATORY_CARE_PROVIDER_SITE_OTHER): Payer: 59 | Admitting: *Deleted

## 2018-11-09 ENCOUNTER — Telehealth: Payer: Self-pay | Admitting: Internal Medicine

## 2018-11-09 DIAGNOSIS — E559 Vitamin D deficiency, unspecified: Secondary | ICD-10-CM

## 2018-11-09 DIAGNOSIS — Z23 Encounter for immunization: Secondary | ICD-10-CM

## 2018-11-09 DIAGNOSIS — E538 Deficiency of other specified B group vitamins: Secondary | ICD-10-CM

## 2018-11-09 MED ORDER — CYANOCOBALAMIN 1000 MCG/ML IJ SOLN
1000.0000 ug | Freq: Once | INTRAMUSCULAR | Status: AC
  Administered 2018-11-09: 1000 ug via INTRAMUSCULAR

## 2018-11-09 NOTE — Telephone Encounter (Signed)
Patient is inquiring on how long she is supposed to get monthly B12 injections.  She said she is supposed to get her Vitamin D checked as well.  She said she completed taking her Vitamin D.

## 2018-11-09 NOTE — Progress Notes (Signed)
Per orders of Dr. Elease Hashimoto , injection of B12 and Shingrix given by Westley Hummer. Patient tolerated injection well.

## 2018-11-10 NOTE — Telephone Encounter (Signed)
Left message on machine for patient to return our call - B12 will be lifetime Lab ordered - needs lab appointment CRM

## 2018-11-11 ENCOUNTER — Ambulatory Visit: Payer: Self-pay | Admitting: *Deleted

## 2018-11-11 NOTE — Telephone Encounter (Signed)
Received shingles vaccine 2nd in the series and B12 injection in the opposite arm 2 days ago. Right arm at site is sore/some redness at the site, size of half dollar, maybe a little larger, minimal swelling under the skin. Warm to touch. No drainage noted, 1st noticed the area today. No oral fever/rash/SOB. She is able to move the arm and shoulder without difficulty. Reviewed care advice including applying ice and using tylenol for the discomfort. No reaction from 1st shingles injection. Monitor the area for any worsening of symptoms or fever.Call back if needed.   Reason for Disposition . Injection site reaction to any vaccine  Protocols used: IMMUNIZATION REACTIONS-A-AH

## 2018-11-13 ENCOUNTER — Other Ambulatory Visit: Payer: Self-pay

## 2018-11-13 ENCOUNTER — Other Ambulatory Visit (INDEPENDENT_AMBULATORY_CARE_PROVIDER_SITE_OTHER): Payer: 59

## 2018-11-13 DIAGNOSIS — E559 Vitamin D deficiency, unspecified: Secondary | ICD-10-CM | POA: Diagnosis not present

## 2018-11-13 LAB — VITAMIN D 25 HYDROXY (VIT D DEFICIENCY, FRACTURES): VITD: 55.61 ng/mL (ref 30.00–100.00)

## 2018-12-15 ENCOUNTER — Other Ambulatory Visit: Payer: Self-pay

## 2018-12-16 ENCOUNTER — Ambulatory Visit (INDEPENDENT_AMBULATORY_CARE_PROVIDER_SITE_OTHER): Payer: 59

## 2018-12-16 DIAGNOSIS — E538 Deficiency of other specified B group vitamins: Secondary | ICD-10-CM

## 2018-12-16 MED ORDER — CYANOCOBALAMIN 1000 MCG/ML IJ SOLN
1000.0000 ug | Freq: Once | INTRAMUSCULAR | Status: AC
  Administered 2018-12-16: 1000 ug via INTRAMUSCULAR

## 2018-12-16 NOTE — Progress Notes (Signed)
After obtaining consent, and per orders of Dr. Hernandez, injection of B12 given by Sheena H Cox. Patient instructed to remain in clinic for 20 minutes afterwards, and to report any adverse reaction to me immediately.  

## 2019-01-04 ENCOUNTER — Other Ambulatory Visit: Payer: Self-pay | Admitting: Internal Medicine

## 2019-01-04 DIAGNOSIS — E785 Hyperlipidemia, unspecified: Secondary | ICD-10-CM

## 2019-01-04 DIAGNOSIS — E039 Hypothyroidism, unspecified: Secondary | ICD-10-CM

## 2019-01-18 ENCOUNTER — Other Ambulatory Visit: Payer: Self-pay

## 2019-01-18 ENCOUNTER — Ambulatory Visit (INDEPENDENT_AMBULATORY_CARE_PROVIDER_SITE_OTHER): Payer: 59 | Admitting: *Deleted

## 2019-01-18 DIAGNOSIS — E538 Deficiency of other specified B group vitamins: Secondary | ICD-10-CM

## 2019-01-18 MED ORDER — CYANOCOBALAMIN 1000 MCG/ML IJ SOLN: 1000.0000 ug | Freq: Once | INTRAMUSCULAR | Status: DC

## 2019-01-18 MED ORDER — CYANOCOBALAMIN 1000 MCG/ML IJ SOLN
1000.0000 ug | Freq: Once | INTRAMUSCULAR | Status: AC
  Administered 2019-01-18: 1000 ug via INTRAMUSCULAR

## 2019-01-18 NOTE — Progress Notes (Signed)
Per orders of Dr. Burchette, injection of Cyanocobalamin 1000mcg given by Lulie Hurd A. Patient tolerated injection well.  

## 2019-02-18 ENCOUNTER — Other Ambulatory Visit: Payer: Self-pay

## 2019-02-18 ENCOUNTER — Ambulatory Visit (INDEPENDENT_AMBULATORY_CARE_PROVIDER_SITE_OTHER): Payer: 59 | Admitting: *Deleted

## 2019-02-18 DIAGNOSIS — E538 Deficiency of other specified B group vitamins: Secondary | ICD-10-CM

## 2019-02-18 MED ORDER — CYANOCOBALAMIN 1000 MCG/ML IJ SOLN
1000.0000 ug | Freq: Once | INTRAMUSCULAR | Status: AC
  Administered 2019-02-18: 15:00:00 1000 ug via INTRAMUSCULAR

## 2019-02-18 NOTE — Progress Notes (Addendum)
Per orders of Dr. Ardyth Harps injection of B12 given by Jobe Gibbon. Patient tolerated injection well.

## 2019-03-11 ENCOUNTER — Encounter: Payer: Self-pay | Admitting: Internal Medicine

## 2019-03-13 ENCOUNTER — Ambulatory Visit: Payer: 59 | Attending: Internal Medicine

## 2019-03-13 DIAGNOSIS — Z23 Encounter for immunization: Secondary | ICD-10-CM

## 2019-03-13 NOTE — Progress Notes (Signed)
   Covid-19 Vaccination Clinic  Name:  Cashlynn Yearwood    MRN: [id number] DOB: [birth year]  03/13/2019  Ms. Dehne was observed post Covid-19 immunization for 15 minutes without incidence. She was provided with Vaccine Information Sheet and instruction to access the V-Safe system.   Ms. Wehner was instructed to call 911 with any severe reactions post vaccine: Marland Kitchen Difficulty breathing  . Swelling of your face and throat  . A fast heartbeat  . A bad rash all over your body  . Dizziness and weakness    Immunizations Administered    Name Date Dose VIS Date Route   Pfizer COVID-19 Vaccine 03/13/2019 10:22 AM 0.3 mL 12/25/2018 Intramuscular   Manufacturer: [employer], [employer]   Lot: [id number]   NDC: [address]-6

## 2019-03-14 ENCOUNTER — Encounter: Payer: Self-pay | Admitting: Internal Medicine

## 2019-03-16 MED ORDER — CITALOPRAM HYDROBROMIDE 40 MG PO TABS: ORAL_TABLET | ORAL | 2 refills | Status: DC

## 2019-03-17 ENCOUNTER — Encounter: Payer: Self-pay | Admitting: Internal Medicine

## 2019-03-18 ENCOUNTER — Other Ambulatory Visit: Payer: Self-pay

## 2019-03-18 ENCOUNTER — Ambulatory Visit (INDEPENDENT_AMBULATORY_CARE_PROVIDER_SITE_OTHER): Payer: 59

## 2019-03-18 DIAGNOSIS — E538 Deficiency of other specified B group vitamins: Secondary | ICD-10-CM | POA: Diagnosis not present

## 2019-03-18 MED ORDER — CYANOCOBALAMIN 1000 MCG/ML IJ SOLN
1000.0000 ug | Freq: Once | INTRAMUSCULAR | Status: AC
  Administered 2019-03-18: 1000 ug via INTRAMUSCULAR

## 2019-03-18 NOTE — Progress Notes (Signed)
Per orders of Dr. Hernandez , injection of B12 given in R deltoid by Aurora Rody R Sonda Coppens. Patient tolerated injection well. 

## 2019-03-18 NOTE — Patient Instructions (Signed)
Health Maintenance Due  Topic Date Due  . Hepatitis C Screening  September 02, 1960  . HIV Screening  02/08/1974  . PAP SMEAR-Modifier  02/09/1980    Depression screen [facility] 2/9 07/29/2018 07/05/2016 07/07/2014  Decreased Interest 0 0 0  Down, Depressed, Hopeless 1 1 0  PHQ - 2 Score 1 1 0  Altered sleeping 1 - -  Tired, decreased energy 0 - -  Change in appetite 0 - -  Feeling bad or failure about yourself  0 - -  Trouble concentrating 0 - -  Moving slowly or fidgety/restless 0 - -  Suicidal thoughts 0 - -  PHQ-9 Score 2 - -  Difficult doing work/chores Not difficult at all - -  Some encounter information is confidential and restricted. Go to Review Flowsheets activity to see all data.

## 2019-04-07 ENCOUNTER — Ambulatory Visit: Payer: 59 | Attending: Internal Medicine

## 2019-04-07 DIAGNOSIS — Z23 Encounter for immunization: Secondary | ICD-10-CM

## 2019-04-07 NOTE — Progress Notes (Signed)
   Covid-19 Vaccination Clinic  Name:  Sarah Wagner    MRN: [id number] DOB: 06-27-1960  04/07/2019  [name] was observed post Covid-19 immunization for 15 minutes without incident. She was provided with Vaccine Information Sheet and instruction to access the V-Safe system.   [name] was instructed to call 911 with any severe reactions post vaccine: Marland Kitchen Difficulty breathing  . Swelling of face and throat  . A fast heartbeat  . A bad rash all over body  . Dizziness and weakness   Immunizations Administered    Name Date Dose VIS Date Route   Pfizer COVID-19 Vaccine 04/07/2019  2:28 PM 0.3 mL 12/25/2018 Intramuscular   Manufacturer: [employer], [employer]   Lot: [id number]   NDC: [address]-8

## 2019-04-19 ENCOUNTER — Other Ambulatory Visit: Payer: Self-pay

## 2019-04-20 ENCOUNTER — Ambulatory Visit (INDEPENDENT_AMBULATORY_CARE_PROVIDER_SITE_OTHER): Payer: 59 | Admitting: *Deleted

## 2019-04-20 DIAGNOSIS — E538 Deficiency of other specified B group vitamins: Secondary | ICD-10-CM

## 2019-04-20 MED ORDER — CYANOCOBALAMIN 1000 MCG/ML IJ SOLN
1000.0000 ug | Freq: Once | INTRAMUSCULAR | Status: AC
  Administered 2019-04-20: 1000 ug via INTRAMUSCULAR

## 2019-04-20 NOTE — Progress Notes (Signed)
Per orders of Dr. Hernandez, injection of B12 given by Montel Vanderhoof. Patient tolerated injection well.  

## 2019-05-20 ENCOUNTER — Ambulatory Visit (INDEPENDENT_AMBULATORY_CARE_PROVIDER_SITE_OTHER): Payer: 59

## 2019-05-20 ENCOUNTER — Other Ambulatory Visit: Payer: Self-pay

## 2019-05-20 DIAGNOSIS — E538 Deficiency of other specified B group vitamins: Secondary | ICD-10-CM | POA: Diagnosis not present

## 2019-05-20 MED ORDER — CYANOCOBALAMIN 1000 MCG/ML IJ SOLN
1000.0000 ug | Freq: Once | INTRAMUSCULAR | Status: AC
  Administered 2019-05-20: 1000 ug via INTRAMUSCULAR

## 2019-05-20 NOTE — Progress Notes (Signed)
Per orders of Dr. Ardyth Harps , injection of B12 given in  L deltoid by Sherrin Daisy. Patient tolerated injection well.

## 2019-05-20 NOTE — Patient Instructions (Signed)
Health Maintenance Due  Topic Date Due  . Hepatitis C Screening  Never done  . HIV Screening  Never done  . PAP SMEAR-Modifier  Never done    Depression screen [facility] 2/9 07/29/2018 07/05/2016 07/07/2014  Decreased Interest 0 0 0  Down, Depressed, Hopeless 1 1 0  PHQ - 2 Score 1 1 0  Altered sleeping 1 - -  Tired, decreased energy 0 - -  Change in appetite 0 - -  Feeling bad or failure about yourself  0 - -  Trouble concentrating 0 - -  Moving slowly or fidgety/restless 0 - -  Suicidal thoughts 0 - -  PHQ-9 Score 2 - -  Difficult doing work/chores Not difficult at all - -  Some encounter information is confidential and restricted. Go to Review Flowsheets activity to see all data.

## 2019-06-18 ENCOUNTER — Other Ambulatory Visit: Payer: Self-pay

## 2019-06-21 ENCOUNTER — Ambulatory Visit (INDEPENDENT_AMBULATORY_CARE_PROVIDER_SITE_OTHER): Payer: 59

## 2019-06-21 ENCOUNTER — Other Ambulatory Visit: Payer: Self-pay

## 2019-06-21 DIAGNOSIS — E538 Deficiency of other specified B group vitamins: Secondary | ICD-10-CM

## 2019-06-21 MED ORDER — CYANOCOBALAMIN 1000 MCG/ML IJ SOLN
1000.0000 ug | Freq: Once | INTRAMUSCULAR | Status: AC
  Administered 2019-06-21: 1000 ug via INTRAMUSCULAR

## 2019-06-21 NOTE — Progress Notes (Signed)
Per orders of Dr. Ardyth Harps , injection of B12 given in  Right deltoid by Alison Murray. Patient tolerated injection well.

## 2019-07-08 ENCOUNTER — Other Ambulatory Visit: Payer: Self-pay | Admitting: Internal Medicine

## 2019-07-08 DIAGNOSIS — E039 Hypothyroidism, unspecified: Secondary | ICD-10-CM

## 2019-07-08 DIAGNOSIS — E785 Hyperlipidemia, unspecified: Secondary | ICD-10-CM

## 2019-07-21 ENCOUNTER — Other Ambulatory Visit: Payer: Self-pay

## 2019-07-21 ENCOUNTER — Ambulatory Visit (INDEPENDENT_AMBULATORY_CARE_PROVIDER_SITE_OTHER): Payer: 59 | Admitting: *Deleted

## 2019-07-21 DIAGNOSIS — E538 Deficiency of other specified B group vitamins: Secondary | ICD-10-CM

## 2019-07-21 MED ORDER — CYANOCOBALAMIN 1000 MCG/ML IJ SOLN
1000.0000 ug | Freq: Once | INTRAMUSCULAR | Status: AC
  Administered 2019-07-21: 1000 ug via INTRAMUSCULAR

## 2019-07-21 NOTE — Progress Notes (Signed)
Per orders of Dr. Hernandez, injection of B12 given by Tiawana Forgy. Patient tolerated injection well.  

## 2019-08-24 ENCOUNTER — Other Ambulatory Visit: Payer: Self-pay

## 2019-08-24 ENCOUNTER — Ambulatory Visit (INDEPENDENT_AMBULATORY_CARE_PROVIDER_SITE_OTHER): Payer: 59 | Admitting: *Deleted

## 2019-08-24 DIAGNOSIS — E538 Deficiency of other specified B group vitamins: Secondary | ICD-10-CM

## 2019-08-24 MED ORDER — CYANOCOBALAMIN 1000 MCG/ML IJ SOLN
1000.0000 ug | Freq: Once | INTRAMUSCULAR | Status: AC
  Administered 2019-08-24: 1000 ug via INTRAMUSCULAR

## 2019-08-24 NOTE — Progress Notes (Signed)
Per orders of Dr. Hernandez Acosta, injection of monthly B 12 given by Quanell Loughney S Johara Lodwick. Patient tolerated injection well. 

## 2019-09-24 ENCOUNTER — Ambulatory Visit (INDEPENDENT_AMBULATORY_CARE_PROVIDER_SITE_OTHER): Payer: 59 | Admitting: *Deleted

## 2019-09-24 ENCOUNTER — Other Ambulatory Visit: Payer: Self-pay

## 2019-09-24 DIAGNOSIS — E538 Deficiency of other specified B group vitamins: Secondary | ICD-10-CM

## 2019-09-24 MED ORDER — CYANOCOBALAMIN 1000 MCG/ML IJ SOLN
1000.0000 ug | Freq: Once | INTRAMUSCULAR | Status: AC
  Administered 2019-09-24: 1000 ug via INTRAMUSCULAR

## 2019-09-24 NOTE — Progress Notes (Signed)
Per orders of Dr. Hernandez, injection of B12 given by Fatma Rutten. Patient tolerated injection well.  

## 2019-10-02 ENCOUNTER — Other Ambulatory Visit: Payer: Self-pay | Admitting: Internal Medicine

## 2019-10-02 DIAGNOSIS — E039 Hypothyroidism, unspecified: Secondary | ICD-10-CM

## 2019-10-02 DIAGNOSIS — E785 Hyperlipidemia, unspecified: Secondary | ICD-10-CM

## 2019-10-25 ENCOUNTER — Other Ambulatory Visit: Payer: Self-pay

## 2019-10-25 ENCOUNTER — Ambulatory Visit (INDEPENDENT_AMBULATORY_CARE_PROVIDER_SITE_OTHER): Payer: 59

## 2019-10-25 DIAGNOSIS — E538 Deficiency of other specified B group vitamins: Secondary | ICD-10-CM | POA: Diagnosis not present

## 2019-10-25 MED ORDER — CYANOCOBALAMIN 1000 MCG/ML IJ SOLN
1000.0000 ug | Freq: Once | INTRAMUSCULAR | Status: AC
  Administered 2019-10-25: 1000 ug via INTRAMUSCULAR

## 2019-10-25 NOTE — Progress Notes (Signed)
Per orders of Dr. Hernandez , injection of Cyanocobalamin 1,000 mcg/mL given by Ladesha Pacini N Doniqua Saxby. °Patient tolerated injection well. ° °

## 2019-10-28 ENCOUNTER — Other Ambulatory Visit: Payer: Self-pay | Admitting: Internal Medicine

## 2019-10-28 DIAGNOSIS — E785 Hyperlipidemia, unspecified: Secondary | ICD-10-CM

## 2019-10-29 ENCOUNTER — Other Ambulatory Visit: Payer: Self-pay | Admitting: Internal Medicine

## 2019-10-29 DIAGNOSIS — E039 Hypothyroidism, unspecified: Secondary | ICD-10-CM

## 2019-11-09 ENCOUNTER — Telehealth: Payer: Self-pay | Admitting: Internal Medicine

## 2019-11-09 DIAGNOSIS — E785 Hyperlipidemia, unspecified: Secondary | ICD-10-CM

## 2019-11-09 MED ORDER — SIMVASTATIN 20 MG PO TABS: 20.0000 mg | ORAL_TABLET | Freq: Every day | ORAL | 0 refills | Status: DC

## 2019-11-09 NOTE — Telephone Encounter (Signed)
Pt is calling in stating that she only has 5 pills left of Rx simvastatin (ZOCOR) 20 MG.  Pharm:  [facility] in [place] (on [address]  Pt has made an appointment for 11/18/2019 at 2:00 and just need to have enough until she comes in for her appointment.

## 2019-11-16 ENCOUNTER — Telehealth: Payer: Self-pay | Admitting: Internal Medicine

## 2019-11-16 NOTE — Telephone Encounter (Signed)
Provider out of office on 11/18/19-- ok to work pt in to r/s per Fleet Contras

## 2019-11-18 ENCOUNTER — Ambulatory Visit: Payer: 59 | Admitting: Internal Medicine

## 2019-11-29 ENCOUNTER — Ambulatory Visit: Payer: 59

## 2019-12-01 ENCOUNTER — Other Ambulatory Visit: Payer: Self-pay | Admitting: Internal Medicine

## 2019-12-01 DIAGNOSIS — E785 Hyperlipidemia, unspecified: Secondary | ICD-10-CM

## 2019-12-02 ENCOUNTER — Encounter: Payer: Self-pay | Admitting: Internal Medicine

## 2019-12-02 ENCOUNTER — Other Ambulatory Visit: Payer: Self-pay | Admitting: Internal Medicine

## 2019-12-02 ENCOUNTER — Ambulatory Visit: Payer: 59 | Admitting: Internal Medicine

## 2019-12-02 ENCOUNTER — Other Ambulatory Visit: Payer: Self-pay

## 2019-12-02 VITALS — BP 110/70 | HR 72 | Temp 97.8°F | Wt 142.2 lb

## 2019-12-02 DIAGNOSIS — E039 Hypothyroidism, unspecified: Secondary | ICD-10-CM

## 2019-12-02 DIAGNOSIS — E785 Hyperlipidemia, unspecified: Secondary | ICD-10-CM

## 2019-12-02 DIAGNOSIS — G4733 Obstructive sleep apnea (adult) (pediatric): Secondary | ICD-10-CM

## 2019-12-02 DIAGNOSIS — E538 Deficiency of other specified B group vitamins: Secondary | ICD-10-CM

## 2019-12-02 DIAGNOSIS — E669 Obesity, unspecified: Secondary | ICD-10-CM

## 2019-12-02 DIAGNOSIS — E559 Vitamin D deficiency, unspecified: Secondary | ICD-10-CM

## 2019-12-02 DIAGNOSIS — Z0000 Encounter for general adult medical examination without abnormal findings: Secondary | ICD-10-CM

## 2019-12-02 DIAGNOSIS — F331 Major depressive disorder, recurrent, moderate: Secondary | ICD-10-CM

## 2019-12-02 LAB — LIPID PANEL
Cholesterol: 184 mg/dL (ref 0–200)
HDL: 47.4 mg/dL (ref >39.00)
LDL Cholesterol: 115 mg/dL — ABNORMAL HIGH (ref 0–99)
NonHDL: 136.27
Total CHOL/HDL Ratio: 4
Triglycerides: 106 mg/dL (ref 0.0–149.0)
VLDL: 21.2 mg/dL (ref 0.0–40.0)

## 2019-12-02 LAB — VITAMIN B12: Vitamin B-12: >1526 pg/mL — ABNORMAL HIGH (ref 211–911)

## 2019-12-02 LAB — CBC WITH DIFFERENTIAL/PLATELET
Basophils Absolute: 0 10*3/uL (ref 0.0–0.1)
Basophils Relative: 0.7 % (ref 0.0–3.0)
Eosinophils Absolute: 0.2 10*3/uL (ref 0.0–0.7)
Eosinophils Relative: 2.8 % (ref 0.0–5.0)
HCT: 40.3 % (ref 36.0–46.0)
Hemoglobin: 13.3 g/dL (ref 12.0–15.0)
Lymphocytes Relative: 26.5 % (ref 12.0–46.0)
Lymphs Abs: 1.6 10*3/uL (ref 0.7–4.0)
MCHC: 33.1 g/dL (ref 30.0–36.0)
MCV: 86.3 fl (ref 78.0–100.0)
Monocytes Absolute: 0.4 10*3/uL (ref 0.1–1.0)
Monocytes Relative: 6.8 % (ref 3.0–12.0)
Neutro Abs: 3.7 10*3/uL (ref 1.4–7.7)
Neutrophils Relative %: 63.2 % (ref 43.0–77.0)
Platelets: 266 10*3/uL (ref 150.0–400.0)
RBC: 4.66 Mil/uL (ref 3.87–5.11)
RDW: 13.7 % (ref 11.5–15.5)
WBC: 5.9 10*3/uL (ref 4.0–10.5)

## 2019-12-02 LAB — COMPREHENSIVE METABOLIC PANEL
ALT: 10 U/L (ref 0–35)
AST: 12 U/L (ref 0–37)
Albumin: 4.1 g/dL (ref 3.5–5.2)
Alkaline Phosphatase: 63 U/L (ref 39–117)
BUN: 15 mg/dL (ref 6–23)
CO2: 28 mEq/L (ref 19–32)
Calcium: 8.7 mg/dL (ref 8.4–10.5)
Chloride: 105 mEq/L (ref 96–112)
Creatinine, Ser: 0.81 mg/dL (ref 0.40–1.20)
GFR: 78.68 mL/min (ref >60.00)
Glucose, Bld: 92 mg/dL (ref 70–99)
Potassium: 4 mEq/L (ref 3.5–5.1)
Sodium: 140 mEq/L (ref 135–145)
Total Bilirubin: 0.5 mg/dL (ref 0.2–1.2)
Total Protein: 6.7 g/dL (ref 6.0–8.3)

## 2019-12-02 LAB — TSH: TSH: 0.3 u[IU]/mL — ABNORMAL LOW (ref 0.35–4.50)

## 2019-12-02 LAB — HEMOGLOBIN A1C: Hgb A1c MFr Bld: 5.8 % (ref 4.6–6.5)

## 2019-12-02 LAB — VITAMIN D 25 HYDROXY (VIT D DEFICIENCY, FRACTURES): VITD: 32.67 ng/mL (ref 30.00–100.00)

## 2019-12-02 MED ORDER — VITAMIN D (ERGOCALCIFEROL) 1.25 MG (50000 UNIT) PO CAPS: 50000.0000 [IU] | ORAL_CAPSULE | ORAL | 0 refills | Status: DC

## 2019-12-02 MED ORDER — SIMVASTATIN 20 MG PO TABS: 20.0000 mg | ORAL_TABLET | Freq: Every day | ORAL | 1 refills | Status: DC

## 2019-12-02 MED ORDER — CYANOCOBALAMIN 1000 MCG/ML IJ SOLN
1000.0000 ug | Freq: Once | INTRAMUSCULAR | Status: AC
  Administered 2019-12-02: 1000 ug via INTRAMUSCULAR

## 2019-12-02 MED ORDER — LEVOTHYROXINE SODIUM 50 MCG PO TABS: 50.0000 ug | ORAL_TABLET | Freq: Every day | ORAL | 1 refills | Status: DC

## 2019-12-02 MED ORDER — LEVOTHYROXINE SODIUM 75 MCG PO TABS: 75.0000 ug | ORAL_TABLET | Freq: Every day | ORAL | 1 refills | Status: DC

## 2019-12-02 NOTE — Addendum Note (Signed)
Addended by: Kern Reap B on: 12/02/2019 05:13 PM   Modules accepted: Orders

## 2019-12-02 NOTE — Progress Notes (Signed)
Established Patient Office Visit     This visit occurred during the SARS-CoV-2 public health emergency.  Safety protocols were in place, including screening questions prior to the visit, additional usage of staff PPE, and extensive cleaning of exam room while observing appropriate contact time as indicated for disinfecting solutions.    CC/Reason for Visit: Annual preventive exam, follow-up chronic medical conditions  HPI: Sarah Wagner is a 59 y.o. female who is coming in today for the above mentioned reasons. Past Medical History is significant for: Hypothyroidism, depression, hyperlipidemia, obstructive sleep apnea who is not currently using her CPAP machine, as well as vitamin D and B12 deficiency. She has no acute complaints. She has been followed by ENT for some nasal polyps that have resolved. She has routine eye and dental care. Since I last saw her she had a hearing aid on her left ear. She had both her Covid booster and her flu vaccine last month. Immunizations are up-to-date. She is requesting a B12 injection today. She is overdue for mammogram, she states she had a colonoscopy by Dr. Almyra Free at the end of the summer, I do not have these records, she was told that she no longer needed Pap smears as she has had a complete hysterectomy.   Past Medical/Surgical History: Past Medical History:  Diagnosis Date  . Allergy   . Anxiety   . Depression   . Headache(784.0)   . Hyperlipidemia   . OSA (obstructive sleep apnea) 09/10/2017  . Thyroid disease     Past Surgical History:  Procedure Laterality Date  . ABDOMINAL HYSTERECTOMY    . HERNIA REPAIR      Social History:  reports that she has never smoked. She has never used smokeless tobacco. She reports that she does not drink alcohol and does not use drugs.  Allergies: Allergies  Allergen Reactions  . Sulfacetamide Sodium     Family History:  Family History  Problem Relation Age of Onset  . Cancer Mother         ovarian  . Parkinsonism Mother   . Depression Mother   . Anxiety disorder Mother   . Cancer Father        brain  . Diabetes Father   . Crohn's disease Father   . Graves' disease Father   . Hyperlipidemia Father   . Depression Father   . Anxiety disorder Father   . Crohn's disease Sister      Current Outpatient Medications:  .  buPROPion (WELLBUTRIN XL) 300 MG 24 hr tablet, Take 300 mg by mouth daily., Disp: , Rfl:  .  citalopram (CELEXA) 40 MG tablet, Take one and half tabs daily, Disp: 30 tablet, Rfl: 2 .  Clocortolone Pivalate (CLODERM) 0.1 % cream, Apply 1 application topically 2 (two) times daily., Disp: 30 g, Rfl: 0 .  levothyroxine (SYNTHROID) 75 MCG tablet, Take 1 tablet (75 mcg total) by mouth daily., Disp: 90 tablet, Rfl: 1 .  LORazepam (ATIVAN) 0.5 MG tablet, Take 0.5 mg by mouth as needed for anxiety., Disp: , Rfl:  .  simvastatin (ZOCOR) 20 MG tablet, Take 1 tablet (20 mg total) by mouth at bedtime., Disp: 90 tablet, Rfl: 1 .  XIIDRA 5 % SOLN, PLACE 1 DROP INTO BOTH EYES TWICE DAILY, Disp: , Rfl: 4  Review of Systems:  Constitutional: Denies fever, chills, diaphoresis, appetite change and fatigue.  HEENT: Denies photophobia, eye pain, redness, hearing loss, ear pain, congestion, sore throat, rhinorrhea, sneezing, mouth sores,  trouble swallowing, neck pain, neck stiffness and tinnitus.   Respiratory: Denies SOB, DOE, cough, chest tightness,  and wheezing.   Cardiovascular: Denies chest pain, palpitations and leg swelling.  Gastrointestinal: Denies nausea, vomiting, abdominal pain, diarrhea, constipation, blood in stool and abdominal distention.  Genitourinary: Denies dysuria, urgency, frequency, hematuria, flank pain and difficulty urinating.  Endocrine: Denies: hot or cold intolerance, sweats, changes in hair or nails, polyuria, polydipsia. Musculoskeletal: Denies myalgias, back pain, joint swelling, arthralgias and gait problem.  Skin: Denies pallor, rash and wound.    Neurological: Denies dizziness, seizures, syncope, weakness, light-headedness, numbness and headaches.  Hematological: Denies adenopathy. Easy bruising, personal or family bleeding history  Psychiatric/Behavioral: Denies suicidal ideation, mood changes, confusion, nervousness, sleep disturbance and agitation    Physical Exam: Vitals:   12/02/19 0732  BP: 110/70  Pulse: 72  Temp: 97.8 F (36.6 C)  TempSrc: Oral  SpO2: 98%  Weight: 142 lb 3.2 oz (64.5 kg)    Body mass index is 27.31 kg/m.   Constitutional: NAD, calm, comfortable Eyes: PERRL, lids and conjunctivae normal, wears corrective lenses ENMT: Mucous membranes are moist. Posterior pharynx clear of any exudate or lesions. Normal dentition. Tympanic membrane is pearly white, no erythema or bulging. Neck: normal, supple, no masses, no thyromegaly Respiratory: clear to auscultation bilaterally, no wheezing, no crackles. Normal respiratory effort. No accessory muscle use.  Cardiovascular: Regular rate and rhythm, no murmurs / rubs / gallops. No extremity edema. 2+ pedal pulses.  Abdomen: no tenderness, no masses palpated. No hepatosplenomegaly. Bowel sounds positive.  Musculoskeletal: no clubbing / cyanosis. No joint deformity upper and lower extremities. Good ROM, no contractures. Normal muscle tone.  Skin: no rashes, lesions, ulcers. No induration Neurologic: CN 2-12 grossly intact. Sensation intact, DTR normal. Strength 5/5 in all 4.  Psychiatric: Normal judgment and insight. Alert and oriented x 3. Normal mood.    Impression and Plan:  Encounter for preventive health examination  -She has routine eye and dental care. -All immunizations are up-to-date. -Screening labs today. -Healthy lifestyle discussed in detail. -She is overdue for screening mammogram, I will request. -She states she had a colonoscopy at the end of the summer with Dr. Benson Norway, will obtain records. -Per prior GYN, no further need for Pap smears although  she will inquire with their office.  Acquired hypothyroidism  -Check TSH, for now continue levothyroxine 75 mcg, her last TSH was 0.510 in July 2020.  Dyslipidemia -Check lipids, refill simvastatin 20 mg, last LDL was 102 in July 2020.  Vitamin D deficiency -Check vitamin D level today.  Vitamin B12 deficiency -Check B12 levels, she is due for B12 injection today.  OSA (obstructive sleep apnea) -She is not using her CPAP.  Obesity (BMI 30-39.9) -Discussed healthy lifestyle, including increased physical activity and better food choices to promote weight loss.  Depression, major, recurrent, moderate (HCC) -Mood is stable.   Office Visit from 12/02/2019 in [place] at [place]  PHQ-9 Total Score 0       Patient Instructions  -Nice seeing you today!!  -Lab work today; will notify you once results are available.  -Mammogram requested.  -Schedule follow up in 1 year or sooner as needed.   Preventive Care 54-58 Years Old, Female Preventive care refers to visits with your health care provider and lifestyle choices that can promote health and wellness. This includes:  A yearly physical exam. This may also be called an annual well check.  Regular dental visits and eye exams.  Immunizations.  Screening  for certain conditions.  Healthy lifestyle choices, such as eating a healthy diet, getting regular exercise, not using drugs or products that contain nicotine and tobacco, and limiting alcohol use. What can I expect for my preventive care visit? Physical exam Your health care provider will check your:  Height and weight. This may be used to calculate body mass index (BMI), which tells if you are at a healthy weight.  Heart rate and blood pressure.  Skin for abnormal spots. Counseling Your health care provider may ask you questions about your:  Alcohol, tobacco, and drug use.  Emotional well-being.  Home and relationship well-being.  Sexual  activity.  Eating habits.  Work and work Statistician.  Method of birth control.  Menstrual cycle.  Pregnancy history. What immunizations do I need?  Influenza (flu) vaccine  This is recommended every year. Tetanus, diphtheria, and pertussis (Tdap) vaccine  You may need a Td booster every 10 years. Varicella (chickenpox) vaccine  You may need this if you have not been vaccinated. Zoster (shingles) vaccine  You may need this after age 8. Measles, mumps, and rubella (MMR) vaccine  You may need at least one dose of MMR if you were born in 1957 or later. You may also need a second dose. Pneumococcal conjugate (PCV13) vaccine  You may need this if you have certain conditions and were not previously vaccinated. Pneumococcal polysaccharide (PPSV23) vaccine  You may need one or two doses if you smoke cigarettes or if you have certain conditions. Meningococcal conjugate (MenACWY) vaccine  You may need this if you have certain conditions. Hepatitis A vaccine  You may need this if you have certain conditions or if you travel or work in places where you may be exposed to hepatitis A. Hepatitis B vaccine  You may need this if you have certain conditions or if you travel or work in places where you may be exposed to hepatitis B. Haemophilus influenzae type b (Hib) vaccine  You may need this if you have certain conditions. Human papillomavirus (HPV) vaccine  If recommended by your health care provider, you may need three doses over 6 months. You may receive vaccines as individual doses or as more than one vaccine together in one shot (combination vaccines). Talk with your health care provider about the risks and benefits of combination vaccines. What tests do I need? Blood tests  Lipid and cholesterol levels. These may be checked every 5 years, or more frequently if you are over 23 years old.  Hepatitis C test.  Hepatitis B test. Screening  Lung cancer screening. You may  have this screening every year starting at age 62 if you have a 30-pack-year history of smoking and currently smoke or have quit within the past 15 years.  Colorectal cancer screening. All adults should have this screening starting at age 76 and continuing until age 77. Your health care provider may recommend screening at age 23 if you are at increased risk. You will have tests every 1-10 years, depending on your results and the type of screening test.  Diabetes screening. This is done by checking your blood sugar (glucose) after you have not eaten for a while (fasting). You may have this done every 1-3 years.  Mammogram. This may be done every 1-2 years. Talk with your health care provider about when you should start having regular mammograms. This may depend on whether you have a family history of breast cancer.  BRCA-related cancer screening. This may be done if you have  a family history of breast, ovarian, tubal, or peritoneal cancers.  Pelvic exam and Pap test. This may be done every 3 years starting at age 67. Starting at age 50, this may be done every 5 years if you have a Pap test in combination with an HPV test. Other tests  Sexually transmitted disease (STD) testing.  Bone density scan. This is done to screen for osteoporosis. You may have this scan if you are at high risk for osteoporosis. Follow these instructions at home: Eating and drinking  Eat a diet that includes fresh fruits and vegetables, whole grains, lean protein, and low-fat dairy.  Take vitamin and mineral supplements as recommended by your health care provider.  Do not drink alcohol if: ? Your health care provider tells you not to drink. ? You are pregnant, may be pregnant, or are planning to become pregnant.  If you drink alcohol: ? Limit how much you have to 0-1 drink a day. ? Be aware of how much alcohol is in your drink. In the [place] one drink equals one 12 oz bottle of beer (355 mL), one 5 oz glass of wine  (148 mL), or one 1 oz glass of hard liquor (44 mL). Lifestyle  Take daily care of your teeth and gums.  Stay active. Exercise for at least 30 minutes on 5 or more days each week.  Do not use any products that contain nicotine or tobacco, such as cigarettes, e-cigarettes, and chewing tobacco. If you need help quitting, ask your health care provider.  If you are sexually active, practice safe sex. Use a condom or other form of birth control (contraception) in order to prevent pregnancy and STIs (sexually transmitted infections).  If told by your health care provider, take low-dose aspirin daily starting at age 67. What's next?  Visit your health care provider once a year for a well check visit.  Ask your health care provider how often you should have your eyes and teeth checked.  Stay up to date on all vaccines. This information is not intended to replace advice given to you by your health care provider. Make sure you discuss any questions you have with your health care provider. Document Revised: 09/11/2017 Document Reviewed: 09/11/2017 Elsevier Patient Education  2020 [place], [place] [facility]

## 2019-12-02 NOTE — Addendum Note (Signed)
Addended by: Lerry Liner on: 12/02/2019 08:10 AM   Modules accepted: Orders

## 2019-12-02 NOTE — Addendum Note (Signed)
Addended by: Treg Diemer. M on: 12/02/2019 08:10 AM   Modules accepted: Orders  

## 2019-12-02 NOTE — Patient Instructions (Signed)
-Nice seeing you today!!  -Lab work today; will notify you once results are available.  -Mammogram requested.  -Schedule follow up in 1 year or sooner as needed.   Preventive Care 36-60 Years Old, Female Preventive care refers to visits with your health care provider and lifestyle choices that can promote health and wellness. This includes:  A yearly physical exam. This may also be called an annual well check.  Regular dental visits and eye exams.  Immunizations.  Screening for certain conditions.  Healthy lifestyle choices, such as eating a healthy diet, getting regular exercise, not using drugs or products that contain nicotine and tobacco, and limiting alcohol use. What can I expect for my preventive care visit? Physical exam Your health care provider will check your:  Height and weight. This may be used to calculate body mass index (BMI), which tells if you are at a healthy weight.  Heart rate and blood pressure.  Skin for abnormal spots. Counseling Your health care provider may ask you questions about your:  Alcohol, tobacco, and drug use.  Emotional well-being.  Home and relationship well-being.  Sexual activity.  Eating habits.  Work and work Statistician.  Method of birth control.  Menstrual cycle.  Pregnancy history. What immunizations do I need?  Influenza (flu) vaccine  This is recommended every year. Tetanus, diphtheria, and pertussis (Tdap) vaccine  You may need a Td booster every 10 years. Varicella (chickenpox) vaccine  You may need this if you have not been vaccinated. Zoster (shingles) vaccine  You may need this after age 36. Measles, mumps, and rubella (MMR) vaccine  You may need at least one dose of MMR if you were born in 1957 or later. You may also need a second dose. Pneumococcal conjugate (PCV13) vaccine  You may need this if you have certain conditions and were not previously vaccinated. Pneumococcal polysaccharide (PPSV23)  vaccine  You may need one or two doses if you smoke cigarettes or if you have certain conditions. Meningococcal conjugate (MenACWY) vaccine  You may need this if you have certain conditions. Hepatitis A vaccine  You may need this if you have certain conditions or if you travel or work in places where you may be exposed to hepatitis A. Hepatitis B vaccine  You may need this if you have certain conditions or if you travel or work in places where you may be exposed to hepatitis B. Haemophilus influenzae type b (Hib) vaccine  You may need this if you have certain conditions. Human papillomavirus (HPV) vaccine  If recommended by your health care provider, you may need three doses over 6 months. You may receive vaccines as individual doses or as more than one vaccine together in one shot (combination vaccines). Talk with your health care provider about the risks and benefits of combination vaccines. What tests do I need? Blood tests  Lipid and cholesterol levels. These may be checked every 5 years, or more frequently if you are over 58 years old.  Hepatitis C test.  Hepatitis B test. Screening  Lung cancer screening. You may have this screening every year starting at age 49 if you have a 30-pack-year history of smoking and currently smoke or have quit within the past 15 years.  Colorectal cancer screening. All adults should have this screening starting at age 63 and continuing until age 43. Your health care provider may recommend screening at age 29 if you are at increased risk. You will have tests every 1-10 years, depending on your results  and the type of screening test.  Diabetes screening. This is done by checking your blood sugar (glucose) after you have not eaten for a while (fasting). You may have this done every 1-3 years.  Mammogram. This may be done every 1-2 years. Talk with your health care provider about when you should start having regular mammograms. This may depend on  whether you have a family history of breast cancer.  BRCA-related cancer screening. This may be done if you have a family history of breast, ovarian, tubal, or peritoneal cancers.  Pelvic exam and Pap test. This may be done every 3 years starting at age 7. Starting at age 20, this may be done every 5 years if you have a Pap test in combination with an HPV test. Other tests  Sexually transmitted disease (STD) testing.  Bone density scan. This is done to screen for osteoporosis. You may have this scan if you are at high risk for osteoporosis. Follow these instructions at home: Eating and drinking  Eat a diet that includes fresh fruits and vegetables, whole grains, lean protein, and low-fat dairy.  Take vitamin and mineral supplements as recommended by your health care provider.  Do not drink alcohol if: ? Your health care provider tells you not to drink. ? You are pregnant, may be pregnant, or are planning to become pregnant.  If you drink alcohol: ? Limit how much you have to 0-1 drink a day. ? Be aware of how much alcohol is in your drink. In the [place] one drink equals one 12 oz bottle of beer (355 mL), one 5 oz glass of wine (148 mL), or one 1 oz glass of hard liquor (44 mL). Lifestyle  Take daily care of your teeth and gums.  Stay active. Exercise for at least 30 minutes on 5 or more days each week.  Do not use any products that contain nicotine or tobacco, such as cigarettes, e-cigarettes, and chewing tobacco. If you need help quitting, ask your health care provider.  If you are sexually active, practice safe sex. Use a condom or other form of birth control (contraception) in order to prevent pregnancy and STIs (sexually transmitted infections).  If told by your health care provider, take low-dose aspirin daily starting at age 78. What's next?  Visit your health care provider once a year for a well check visit.  Ask your health care provider how often you should have your  eyes and teeth checked.  Stay up to date on all vaccines. This information is not intended to replace advice given to you by your health care provider. Make sure you discuss any questions you have with your health care provider. Document Revised: 09/11/2017 Document Reviewed: 09/11/2017 Elsevier Patient Education  2020 [employer].

## 2019-12-03 ENCOUNTER — Telehealth: Payer: Self-pay | Admitting: Internal Medicine

## 2019-12-03 NOTE — Telephone Encounter (Signed)
Pt is returning Rachel's call. 

## 2019-12-06 ENCOUNTER — Other Ambulatory Visit: Payer: Self-pay | Admitting: Internal Medicine

## 2019-12-06 DIAGNOSIS — E039 Hypothyroidism, unspecified: Secondary | ICD-10-CM

## 2019-12-06 DIAGNOSIS — E559 Vitamin D deficiency, unspecified: Secondary | ICD-10-CM

## 2019-12-06 NOTE — Telephone Encounter (Signed)
Released to mychart with a result note per patient request  Labs ordered

## 2019-12-06 NOTE — Telephone Encounter (Signed)
Patient returned Rachels call and would like for the message to be sent to her through MyChart so they don't have to keep playing phone tag.

## 2020-01-03 ENCOUNTER — Ambulatory Visit: Payer: 59

## 2020-02-18 ENCOUNTER — Other Ambulatory Visit: Payer: Self-pay | Admitting: Internal Medicine

## 2020-02-18 ENCOUNTER — Encounter: Payer: Self-pay | Admitting: Internal Medicine

## 2020-02-18 DIAGNOSIS — E559 Vitamin D deficiency, unspecified: Secondary | ICD-10-CM

## 2020-02-22 ENCOUNTER — Encounter: Payer: Self-pay | Admitting: Internal Medicine

## 2020-03-22 ENCOUNTER — Other Ambulatory Visit: Payer: Self-pay | Admitting: Internal Medicine

## 2020-03-22 DIAGNOSIS — Z1231 Encounter for screening mammogram for malignant neoplasm of breast: Secondary | ICD-10-CM

## 2020-03-23 ENCOUNTER — Other Ambulatory Visit: Payer: Self-pay

## 2020-03-24 ENCOUNTER — Other Ambulatory Visit: Payer: 59

## 2020-03-24 ENCOUNTER — Ambulatory Visit: Payer: 59 | Admitting: Internal Medicine

## 2020-03-24 ENCOUNTER — Encounter: Payer: Self-pay | Admitting: Internal Medicine

## 2020-03-24 DIAGNOSIS — E559 Vitamin D deficiency, unspecified: Secondary | ICD-10-CM

## 2020-03-24 DIAGNOSIS — E538 Deficiency of other specified B group vitamins: Secondary | ICD-10-CM

## 2020-03-24 LAB — VITAMIN B12: Vitamin B-12: 970 pg/mL — ABNORMAL HIGH (ref 211–911)

## 2020-03-24 LAB — VITAMIN D 25 HYDROXY (VIT D DEFICIENCY, FRACTURES): VITD: 93.51 ng/mL (ref 30.00–100.00)

## 2020-03-24 NOTE — Addendum Note (Signed)
Addended by: Evert Kohl D on: 03/24/2020 11:45 AM   Modules accepted: Orders

## 2020-05-09 ENCOUNTER — Other Ambulatory Visit: Payer: Self-pay | Admitting: Internal Medicine

## 2020-05-09 DIAGNOSIS — Z1231 Encounter for screening mammogram for malignant neoplasm of breast: Secondary | ICD-10-CM

## 2020-05-17 ENCOUNTER — Ambulatory Visit
Admission: RE | Admit: 2020-05-17 | Discharge: 2020-05-17 | Disposition: A | Discharge status: Home / Self Care | Payer: 59 | Source: Ambulatory Visit | Attending: Internal Medicine | Admitting: Internal Medicine

## 2020-05-17 ENCOUNTER — Other Ambulatory Visit: Payer: Self-pay

## 2020-05-17 DIAGNOSIS — Z1231 Encounter for screening mammogram for malignant neoplasm of breast: Secondary | ICD-10-CM

## 2020-05-26 ENCOUNTER — Other Ambulatory Visit: Payer: Self-pay | Admitting: Internal Medicine

## 2020-05-26 DIAGNOSIS — E785 Hyperlipidemia, unspecified: Secondary | ICD-10-CM

## 2020-05-27 ENCOUNTER — Other Ambulatory Visit: Payer: Self-pay | Admitting: Internal Medicine

## 2020-05-27 DIAGNOSIS — E039 Hypothyroidism, unspecified: Secondary | ICD-10-CM

## 2020-05-29 NOTE — Telephone Encounter (Signed)
levothyroxine (SYNTHROID) 50 MCG tablet   [id number] - [place], [place] - [address] AT [place] [address] Phone:  [phone]  Fax:  [phone]

## 2020-07-02 ENCOUNTER — Encounter: Payer: Self-pay | Admitting: Internal Medicine

## 2020-09-13 ENCOUNTER — Other Ambulatory Visit: Payer: Self-pay

## 2020-09-14 ENCOUNTER — Ambulatory Visit (INDEPENDENT_AMBULATORY_CARE_PROVIDER_SITE_OTHER): Payer: BC Managed Care – PPO | Admitting: Internal Medicine

## 2020-09-14 ENCOUNTER — Encounter: Payer: Self-pay | Admitting: Internal Medicine

## 2020-09-14 VITALS — BP 110/70 | HR 86 | Temp 98.1°F | Wt 149.7 lb

## 2020-09-14 DIAGNOSIS — G8929 Other chronic pain: Secondary | ICD-10-CM | POA: Diagnosis not present

## 2020-09-14 DIAGNOSIS — M959 Acquired deformity of musculoskeletal system, unspecified: Secondary | ICD-10-CM | POA: Diagnosis not present

## 2020-09-14 DIAGNOSIS — M25512 Pain in left shoulder: Secondary | ICD-10-CM | POA: Diagnosis not present

## 2020-09-14 NOTE — Progress Notes (Signed)
Established Patient Office Visit     This visit occurred during the SARS-CoV-2 public health emergency.  Safety protocols were in place, including screening questions prior to the visit, additional usage of staff PPE, and extensive cleaning of exam room while observing appropriate contact time as indicated for disinfecting solutions.    CC/Reason for Visit: Discuss some acute concerns  HPI: Sarah Wagner is a 60 y.o. female who is coming in today for the above mentioned reasons.  She is here today to discuss 2 issues:  1.  For a few months she has noted deformity of right greater than left fifth DIP joints.  They are not painful.  2.  She suffered a fall over the summer down some steps.  Did not lose consciousness.  She fell on the left side of her body.  Ever since then she continues to complain of some achiness of her left shoulder.  It hurts with extreme ranges of motion.  Past Medical/Surgical History: Past Medical History:  Diagnosis Date   Allergy    Anxiety    Depression    Headache(784.0)    Hyperlipidemia    OSA (obstructive sleep apnea) 09/10/2017   Thyroid disease     Past Surgical History:  Procedure Laterality Date   ABDOMINAL HYSTERECTOMY     HERNIA REPAIR      Social History:  reports that she has never smoked. She has never used smokeless tobacco. She reports that she does not drink alcohol and does not use drugs.  Allergies: Allergies  Allergen Reactions   Sulfacetamide Sodium     Family History:  Family History  Problem Relation Age of Onset   Cancer Mother        ovarian   Parkinsonism Mother    Depression Mother    Anxiety disorder Mother    Cancer Father        brain   Diabetes Father    Crohn's disease Father    Graves' disease Father    Hyperlipidemia Father    Depression Father    Anxiety disorder Father    Crohn's disease Sister      Current Outpatient Medications:    buPROPion (WELLBUTRIN XL) 300 MG 24 hr tablet,  Take 300 mg by mouth daily., Disp: , Rfl:    citalopram (CELEXA) 40 MG tablet, Take one and half tabs daily, Disp: 30 tablet, Rfl: 2   Clocortolone Pivalate (CLODERM) 0.1 % cream, Apply 1 application topically 2 (two) times daily., Disp: 30 g, Rfl: 0   levothyroxine (SYNTHROID) 50 MCG tablet, TAKE 1 TABLET BY MOUTH EVERY DAY, Disp: 90 tablet, Rfl: 1   simvastatin (ZOCOR) 20 MG tablet, TAKE 1 TABLET BY MOUTH EVERYDAY AT BEDTIME, Disp: 90 tablet, Rfl: 1   LORazepam (ATIVAN) 0.5 MG tablet, Take 0.5 mg by mouth as needed for anxiety. (Patient not taking: Reported on 09/14/2020), Disp: , Rfl:    XIIDRA 5 % SOLN, PLACE 1 DROP INTO BOTH EYES TWICE DAILY (Patient not taking: Reported on 09/14/2020), Disp: , Rfl: 4  Review of Systems:  Constitutional: Denies fever, chills, diaphoresis, appetite change and fatigue.  HEENT: Denies photophobia, eye pain, redness, hearing loss, ear pain, congestion, sore throat, rhinorrhea, sneezing, mouth sores, trouble swallowing, neck pain, neck stiffness and tinnitus.   Respiratory: Denies SOB, DOE, cough, chest tightness,  and wheezing.   Cardiovascular: Denies chest pain, palpitations and leg swelling.  Gastrointestinal: Denies nausea, vomiting, abdominal pain, diarrhea, constipation, blood in stool and abdominal  distention.  Genitourinary: Denies dysuria, urgency, frequency, hematuria, flank pain and difficulty urinating.  Endocrine: Denies: hot or cold intolerance, sweats, changes in hair or nails, polyuria, polydipsia. Musculoskeletal: Denies myalgias, back pain, joint swelling,  and gait problem.  Skin: Denies pallor, rash and wound.  Neurological: Denies dizziness, seizures, syncope, weakness, light-headedness, numbness and headaches.  Hematological: Denies adenopathy. Easy bruising, personal or family bleeding history  Psychiatric/Behavioral: Denies suicidal ideation, mood changes, confusion, nervousness, sleep disturbance and agitation    Physical Exam: Vitals:    09/14/20 1428  BP: 110/70  Pulse: 86  Temp: 98.1 F (36.7 C)  TempSrc: Oral  SpO2: 97%  Wagner: 149 lb 11.2 oz (67.9 kg)    Body mass index is 28.76 kg/m.   Constitutional: NAD, calm, comfortable Eyes: PERRL, lids and conjunctivae normal, wears corrective lenses ENMT: Mucous membranes are moist.  Musculoskeletal: She has pain with extreme abduction, frontal raise of her left shoulder.  Neurologic: Grossly intact and nonfocal Psychiatric: Normal judgment and insight. Alert and oriented x 3. Normal mood.    Impression and Plan:  Chronic left shoulder pain  - Plan: DG Shoulder Left -I believe it is reasonable to do an x-ray given pain months after fall. -Suspect this might be frozen shoulder/tendinitis, doubt fracture. -PT might be helpful but she prefers to hold off for now.  Deformity of bone bilateral left fifth DIP joints -Suspect related to osteoarthritis.  Deformities are ulnar, have advised occasional NSAID use.     Chaya Jan, MD [facility]

## 2020-09-28 DIAGNOSIS — H2513 Age-related nuclear cataract, bilateral: Secondary | ICD-10-CM | POA: Diagnosis not present

## 2020-09-28 DIAGNOSIS — H16223 Keratoconjunctivitis sicca, not specified as Sjogren's, bilateral: Secondary | ICD-10-CM | POA: Diagnosis not present

## 2020-09-28 DIAGNOSIS — H43393 Other vitreous opacities, bilateral: Secondary | ICD-10-CM | POA: Diagnosis not present

## 2020-10-26 DIAGNOSIS — F411 Generalized anxiety disorder: Secondary | ICD-10-CM | POA: Diagnosis not present

## 2020-10-26 DIAGNOSIS — F410 Panic disorder [episodic paroxysmal anxiety] without agoraphobia: Secondary | ICD-10-CM | POA: Diagnosis not present

## 2020-10-26 DIAGNOSIS — F331 Major depressive disorder, recurrent, moderate: Secondary | ICD-10-CM | POA: Diagnosis not present

## 2020-11-25 ENCOUNTER — Other Ambulatory Visit: Payer: Self-pay | Admitting: Internal Medicine

## 2020-11-25 DIAGNOSIS — E039 Hypothyroidism, unspecified: Secondary | ICD-10-CM

## 2020-11-25 DIAGNOSIS — E785 Hyperlipidemia, unspecified: Secondary | ICD-10-CM

## 2020-12-01 ENCOUNTER — Other Ambulatory Visit: Payer: Self-pay | Admitting: Internal Medicine

## 2020-12-01 DIAGNOSIS — E039 Hypothyroidism, unspecified: Secondary | ICD-10-CM

## 2020-12-01 DIAGNOSIS — E785 Hyperlipidemia, unspecified: Secondary | ICD-10-CM

## 2020-12-15 DIAGNOSIS — H02886 Meibomian gland dysfunction of left eye, unspecified eyelid: Secondary | ICD-10-CM | POA: Diagnosis not present

## 2020-12-15 DIAGNOSIS — H16223 Keratoconjunctivitis sicca, not specified as Sjogren's, bilateral: Secondary | ICD-10-CM | POA: Diagnosis not present

## 2020-12-15 DIAGNOSIS — H02883 Meibomian gland dysfunction of right eye, unspecified eyelid: Secondary | ICD-10-CM | POA: Diagnosis not present

## 2020-12-15 DIAGNOSIS — H16141 Punctate keratitis, right eye: Secondary | ICD-10-CM | POA: Diagnosis not present

## 2021-01-24 DIAGNOSIS — F410 Panic disorder [episodic paroxysmal anxiety] without agoraphobia: Secondary | ICD-10-CM | POA: Diagnosis not present

## 2021-01-24 DIAGNOSIS — F331 Major depressive disorder, recurrent, moderate: Secondary | ICD-10-CM | POA: Diagnosis not present

## 2021-01-24 DIAGNOSIS — F411 Generalized anxiety disorder: Secondary | ICD-10-CM | POA: Diagnosis not present

## 2021-03-07 ENCOUNTER — Ambulatory Visit (INDEPENDENT_AMBULATORY_CARE_PROVIDER_SITE_OTHER): Payer: BC Managed Care – PPO | Admitting: Internal Medicine

## 2021-03-07 VITALS — BP 110/80 | HR 80 | Temp 98.1°F | Wt 152.5 lb

## 2021-03-07 DIAGNOSIS — E559 Vitamin D deficiency, unspecified: Secondary | ICD-10-CM | POA: Diagnosis not present

## 2021-03-07 DIAGNOSIS — F331 Major depressive disorder, recurrent, moderate: Secondary | ICD-10-CM | POA: Diagnosis not present

## 2021-03-07 DIAGNOSIS — E785 Hyperlipidemia, unspecified: Secondary | ICD-10-CM

## 2021-03-07 DIAGNOSIS — E538 Deficiency of other specified B group vitamins: Secondary | ICD-10-CM

## 2021-03-07 DIAGNOSIS — E039 Hypothyroidism, unspecified: Secondary | ICD-10-CM | POA: Diagnosis not present

## 2021-03-07 DIAGNOSIS — R21 Rash and other nonspecific skin eruption: Secondary | ICD-10-CM | POA: Diagnosis not present

## 2021-03-07 LAB — LIPID PANEL
Cholesterol: 192 mg/dL (ref 0–200)
HDL: 55.9 mg/dL (ref >39.00)
LDL Cholesterol: 117 mg/dL — ABNORMAL HIGH (ref 0–99)
NonHDL: 136.02
Total CHOL/HDL Ratio: 3
Triglycerides: 95 mg/dL (ref 0.0–149.0)
VLDL: 19 mg/dL (ref 0.0–40.0)

## 2021-03-07 LAB — VITAMIN D 25 HYDROXY (VIT D DEFICIENCY, FRACTURES): VITD: 39.26 ng/mL (ref 30.00–100.00)

## 2021-03-07 LAB — COMPREHENSIVE METABOLIC PANEL
ALT: 11 U/L (ref 0–35)
AST: 12 U/L (ref 0–37)
Albumin: 4.4 g/dL (ref 3.5–5.2)
Alkaline Phosphatase: 61 U/L (ref 39–117)
BUN: 11 mg/dL (ref 6–23)
CO2: 33 mEq/L — ABNORMAL HIGH (ref 19–32)
Calcium: 9.8 mg/dL (ref 8.4–10.5)
Chloride: 103 mEq/L (ref 96–112)
Creatinine, Ser: 0.92 mg/dL (ref 0.40–1.20)
GFR: 66.94 mL/min (ref >60.00)
Glucose, Bld: 112 mg/dL — ABNORMAL HIGH (ref 70–99)
Potassium: 4.3 mEq/L (ref 3.5–5.1)
Sodium: 140 mEq/L (ref 135–145)
Total Bilirubin: 0.4 mg/dL (ref 0.2–1.2)
Total Protein: 7.3 g/dL (ref 6.0–8.3)

## 2021-03-07 LAB — CBC WITH DIFFERENTIAL/PLATELET
Basophils Absolute: 0.1 10*3/uL (ref 0.0–0.1)
Basophils Relative: 0.9 % (ref 0.0–3.0)
Eosinophils Absolute: 0.3 10*3/uL (ref 0.0–0.7)
Eosinophils Relative: 3.8 % (ref 0.0–5.0)
HCT: 41.1 % (ref 36.0–46.0)
Hemoglobin: 13.5 g/dL (ref 12.0–15.0)
Lymphocytes Relative: 24.2 % (ref 12.0–46.0)
Lymphs Abs: 1.6 10*3/uL (ref 0.7–4.0)
MCHC: 32.8 g/dL (ref 30.0–36.0)
MCV: 86.1 fl (ref 78.0–100.0)
Monocytes Absolute: 0.4 10*3/uL (ref 0.1–1.0)
Monocytes Relative: 6.2 % (ref 3.0–12.0)
Neutro Abs: 4.4 10*3/uL (ref 1.4–7.7)
Neutrophils Relative %: 64.9 % (ref 43.0–77.0)
Platelets: 306 10*3/uL (ref 150.0–400.0)
RBC: 4.77 Mil/uL (ref 3.87–5.11)
RDW: 13.4 % (ref 11.5–15.5)
WBC: 6.8 10*3/uL (ref 4.0–10.5)

## 2021-03-07 LAB — VITAMIN B12: Vitamin B-12: 526 pg/mL (ref 211–911)

## 2021-03-07 LAB — TSH: TSH: 5.5 u[IU]/mL (ref 0.35–5.50)

## 2021-03-07 MED ORDER — SIMVASTATIN 20 MG PO TABS: 20.0000 mg | ORAL_TABLET | Freq: Every day | ORAL | 1 refills | Status: DC

## 2021-03-07 MED ORDER — CLOCORTOLONE PIVALATE 0.1 % EX CREA: 1.0000 "application " | TOPICAL_CREAM | Freq: Two times a day (BID) | CUTANEOUS | 0 refills | Status: DC

## 2021-03-07 MED ORDER — LEVOTHYROXINE SODIUM 50 MCG PO TABS: 50.0000 ug | ORAL_TABLET | Freq: Every day | ORAL | 1 refills | Status: DC

## 2021-03-07 NOTE — Progress Notes (Signed)
Established Patient Office Visit     This visit occurred during the SARS-CoV-2 public health emergency.  Safety protocols were in place, including screening questions prior to the visit, additional usage of staff PPE, and extensive cleaning of exam room while observing appropriate contact time as indicated for disinfecting solutions.    CC/Reason for Visit: Follow-up chronic conditions, medication refills  HPI: Sarah Wagner is a 61 y.o. female who is coming in today for the above mentioned reasons. Past Medical History is significant for: Hypothyroidism, depression, hyperlipidemia, obstructive sleep apnea, vitamin D and B12 deficiency.  She is feeling well and has no acute concerns.  She did have COVID 2 weeks ago and is having a little bit of a longer time bouncing back.  She is due for medication refills.  She is overdue for labs and is requesting them today.   Past Medical/Surgical History: Past Medical History:  Diagnosis Date   Allergy    Anxiety    Depression    Headache(784.0)    Hyperlipidemia    OSA (obstructive sleep apnea) 09/10/2017   Thyroid disease     Past Surgical History:  Procedure Laterality Date   ABDOMINAL HYSTERECTOMY     HERNIA REPAIR      Social History:  reports that she has never smoked. She has never used smokeless tobacco. She reports that she does not drink alcohol and does not use drugs.  Allergies: Allergies  Allergen Reactions   Sulfacetamide Sodium     Family History:  Family History  Problem Relation Age of Onset   Cancer Mother        ovarian   Parkinsonism Mother    Depression Mother    Anxiety disorder Mother    Cancer Father        brain   Diabetes Father    Crohn's disease Father    Graves' disease Father    Hyperlipidemia Father    Depression Father    Anxiety disorder Father    Crohn's disease Sister      Current Outpatient Medications:    buPROPion (WELLBUTRIN XL) 300 MG 24 hr tablet, Take 300 mg by  mouth daily., Disp: , Rfl:    citalopram (CELEXA) 40 MG tablet, Take one and half tabs daily, Disp: 30 tablet, Rfl: 2   Omega-3 Fatty Acids (FISH OIL) 1000 MG CAPS, Take by mouth., Disp: , Rfl:    Clocortolone Pivalate (CLODERM) 0.1 % cream, Apply 1 application topically 2 (two) times daily., Disp: 30 g, Rfl: 0   levothyroxine (SYNTHROID) 50 MCG tablet, Take 1 tablet (50 mcg total) by mouth daily., Disp: 90 tablet, Rfl: 1   simvastatin (ZOCOR) 20 MG tablet, Take 1 tablet (20 mg total) by mouth at bedtime., Disp: 90 tablet, Rfl: 1  Review of Systems:  Constitutional: Denies fever, chills, diaphoresis, appetite change and fatigue.  HEENT: Denies photophobia, eye pain, redness, hearing loss, ear pain, congestion, sore throat, rhinorrhea, sneezing, mouth sores, trouble swallowing, neck pain, neck stiffness and tinnitus.   Respiratory: Denies SOB, DOE, cough, chest tightness,  and wheezing.   Cardiovascular: Denies chest pain, palpitations and leg swelling.  Gastrointestinal: Denies nausea, vomiting, abdominal pain, diarrhea, constipation, blood in stool and abdominal distention.  Genitourinary: Denies dysuria, urgency, frequency, hematuria, flank pain and difficulty urinating.  Endocrine: Denies: hot or cold intolerance, sweats, changes in hair or nails, polyuria, polydipsia. Musculoskeletal: Denies myalgias, back pain, joint swelling, arthralgias and gait problem.  Skin: Denies pallor, rash and wound.  Neurological: Denies dizziness, seizures, syncope, weakness, light-headedness, numbness and headaches.  Hematological: Denies adenopathy. Easy bruising, personal or family bleeding history  Psychiatric/Behavioral: Denies suicidal ideation, mood changes, confusion, nervousness, sleep disturbance and agitation    Physical Exam: Vitals:   03/07/21 0901  BP: 110/80  Pulse: 80  Temp: 98.1 F (36.7 C)  TempSrc: Oral  SpO2: 95%  Weight: 152 lb 8 oz (69.2 kg)    Body mass index is 29.29  kg/m.   Constitutional: NAD, calm, comfortable Eyes: PERRL, lids and conjunctivae normal ENMT: Mucous membranes are moist.  Respiratory: clear to auscultation bilaterally, no wheezing, no crackles. Normal respiratory effort. No accessory muscle use.  Cardiovascular: Regular rate and rhythm, no murmurs / rubs / gallops. No extremity edema.  Neurologic: Grossly intact and nonfocal Psychiatric: Normal judgment and insight. Alert and oriented x 3. Normal mood.    Impression and Plan:  Depression, major, recurrent, moderate ([place])  - Plan: CBC with Differential/Platelet, Comprehensive metabolic panel Flowsheet Row Office Visit from 03/07/2021 in [facility] at [place]  PHQ-9 Total Score 4      -Mood is stable, continue Wellbutrin and Celexa.  Rash  - Plan: Clocortolone Pivalate (CLODERM) 0.1 % cream  Acquired hypothyroidism  - Plan: levothyroxine (SYNTHROID) 50 MCG tablet, TSH  Dyslipidemia -  Plan: simvastatin (ZOCOR) 20 MG tablet, Lipid panel  Vitamin D deficiency  - Plan: VITAMIN D 25 Hydroxy (Vit-D Deficiency, Fractures)  Vitamin B12 deficiency  - Plan: Vitamin B12  Time spent: 31 minutes reviewing chart, interviewing and examining patient and formulating plan of care.    Lelon Frohlich, MD [facility] at [facility]

## 2021-03-09 DIAGNOSIS — H02882 Meibomian gland dysfunction right lower eyelid: Secondary | ICD-10-CM | POA: Diagnosis not present

## 2021-03-09 DIAGNOSIS — H02885 Meibomian gland dysfunction left lower eyelid: Secondary | ICD-10-CM | POA: Diagnosis not present

## 2021-03-09 DIAGNOSIS — H0012 Chalazion right lower eyelid: Secondary | ICD-10-CM | POA: Diagnosis not present

## 2021-03-09 DIAGNOSIS — H16223 Keratoconjunctivitis sicca, not specified as Sjogren's, bilateral: Secondary | ICD-10-CM | POA: Diagnosis not present

## 2021-03-09 DIAGNOSIS — H16141 Punctate keratitis, right eye: Secondary | ICD-10-CM | POA: Diagnosis not present

## 2021-04-23 DIAGNOSIS — F411 Generalized anxiety disorder: Secondary | ICD-10-CM | POA: Diagnosis not present

## 2021-04-23 DIAGNOSIS — F410 Panic disorder [episodic paroxysmal anxiety] without agoraphobia: Secondary | ICD-10-CM | POA: Diagnosis not present

## 2021-04-23 DIAGNOSIS — F331 Major depressive disorder, recurrent, moderate: Secondary | ICD-10-CM | POA: Diagnosis not present

## 2021-05-28 DIAGNOSIS — J0100 Acute maxillary sinusitis, unspecified: Secondary | ICD-10-CM | POA: Diagnosis not present

## 2021-06-13 ENCOUNTER — Ambulatory Visit (INDEPENDENT_AMBULATORY_CARE_PROVIDER_SITE_OTHER): Payer: BC Managed Care – PPO | Admitting: Family Medicine

## 2021-06-13 ENCOUNTER — Encounter: Payer: Self-pay | Admitting: Family Medicine

## 2021-06-13 VITALS — BP 124/82 | HR 74 | Temp 98.6°F | Wt 152.0 lb

## 2021-06-13 DIAGNOSIS — J020 Streptococcal pharyngitis: Secondary | ICD-10-CM

## 2021-06-13 DIAGNOSIS — J029 Acute pharyngitis, unspecified: Secondary | ICD-10-CM

## 2021-06-13 LAB — POCT RAPID STREP A (OFFICE): Rapid Strep A Screen: POSITIVE — AB

## 2021-06-13 MED ORDER — CEFUROXIME AXETIL 500 MG PO TABS: 500.0000 mg | ORAL_TABLET | Freq: Two times a day (BID) | ORAL | 0 refills | Status: AC

## 2021-06-13 NOTE — Progress Notes (Signed)
   Subjective:    Patient ID: Sarah Wagner, female    DOB: 1960-12-19, 61 y.o.   MRN: [id number]  HPI Here for 5 days of ST, PND and a dry cough. No fever  or SOB.    Review of Systems  Constitutional: Negative.   HENT:  Positive for congestion, postnasal drip and sore throat. Negative for ear pain and sinus pain.   Eyes: Negative.   Respiratory:  Positive for cough. Negative for shortness of breath and wheezing.   Gastrointestinal: Negative.       Objective:   Physical Exam Constitutional:      Appearance: Normal appearance. She is not ill-appearing.  HENT:     Right Ear: Tympanic membrane, ear canal and external ear normal.     Left Ear: Tympanic membrane, ear canal and external ear normal.     Nose: Nose normal.     Mouth/Throat:     Pharynx: Posterior oropharyngeal erythema present. No oropharyngeal exudate.  Eyes:     Conjunctiva/sclera: Conjunctivae normal.  Pulmonary:     Effort: Pulmonary effort is normal.     Breath sounds: Normal breath sounds.  Lymphadenopathy:     Cervical: No cervical adenopathy.  Neurological:     Mental Status: She is alert.          Assessment & Plan:  Strep pharyngitis, treat with 10 days of Cefuroxime. Add Mucinex as needed.  Alysia Penna, MD

## 2021-07-18 DIAGNOSIS — F410 Panic disorder [episodic paroxysmal anxiety] without agoraphobia: Secondary | ICD-10-CM | POA: Diagnosis not present

## 2021-07-18 DIAGNOSIS — F411 Generalized anxiety disorder: Secondary | ICD-10-CM | POA: Diagnosis not present

## 2021-07-18 DIAGNOSIS — F331 Major depressive disorder, recurrent, moderate: Secondary | ICD-10-CM | POA: Diagnosis not present

## 2021-08-20 ENCOUNTER — Other Ambulatory Visit: Payer: Self-pay | Admitting: Internal Medicine

## 2021-08-20 DIAGNOSIS — Z1231 Encounter for screening mammogram for malignant neoplasm of breast: Secondary | ICD-10-CM

## 2021-08-28 ENCOUNTER — Other Ambulatory Visit: Payer: Self-pay | Admitting: Internal Medicine

## 2021-08-28 DIAGNOSIS — E785 Hyperlipidemia, unspecified: Secondary | ICD-10-CM

## 2021-08-28 DIAGNOSIS — E039 Hypothyroidism, unspecified: Secondary | ICD-10-CM

## 2021-09-05 ENCOUNTER — Ambulatory Visit: Payer: BC Managed Care – PPO

## 2021-09-19 ENCOUNTER — Ambulatory Visit
Admission: RE | Admit: 2021-09-19 | Discharge: 2021-09-19 | Disposition: A | Discharge status: Home / Self Care | Payer: BC Managed Care – PPO | Source: Ambulatory Visit | Attending: Internal Medicine | Admitting: Internal Medicine

## 2021-09-19 DIAGNOSIS — Z1231 Encounter for screening mammogram for malignant neoplasm of breast: Secondary | ICD-10-CM

## 2021-10-05 DIAGNOSIS — H3561 Retinal hemorrhage, right eye: Secondary | ICD-10-CM | POA: Diagnosis not present

## 2021-10-05 DIAGNOSIS — H43393 Other vitreous opacities, bilateral: Secondary | ICD-10-CM | POA: Diagnosis not present

## 2021-10-05 DIAGNOSIS — H2513 Age-related nuclear cataract, bilateral: Secondary | ICD-10-CM | POA: Diagnosis not present

## 2021-10-05 DIAGNOSIS — H16223 Keratoconjunctivitis sicca, not specified as Sjogren's, bilateral: Secondary | ICD-10-CM | POA: Diagnosis not present

## 2021-10-07 ENCOUNTER — Encounter: Payer: Self-pay | Admitting: Internal Medicine

## 2021-11-26 DIAGNOSIS — H16223 Keratoconjunctivitis sicca, not specified as Sjogren's, bilateral: Secondary | ICD-10-CM | POA: Diagnosis not present

## 2021-11-26 DIAGNOSIS — H02885 Meibomian gland dysfunction left lower eyelid: Secondary | ICD-10-CM | POA: Diagnosis not present

## 2021-11-26 DIAGNOSIS — H02882 Meibomian gland dysfunction right lower eyelid: Secondary | ICD-10-CM | POA: Diagnosis not present

## 2022-01-15 DIAGNOSIS — F411 Generalized anxiety disorder: Secondary | ICD-10-CM | POA: Diagnosis not present

## 2022-01-15 DIAGNOSIS — F331 Major depressive disorder, recurrent, moderate: Secondary | ICD-10-CM | POA: Diagnosis not present

## 2022-01-15 DIAGNOSIS — F410 Panic disorder [episodic paroxysmal anxiety] without agoraphobia: Secondary | ICD-10-CM | POA: Diagnosis not present

## 2022-02-24 ENCOUNTER — Other Ambulatory Visit: Payer: Self-pay | Admitting: Internal Medicine

## 2022-02-24 ENCOUNTER — Encounter: Payer: Self-pay | Admitting: Internal Medicine

## 2022-02-24 DIAGNOSIS — E039 Hypothyroidism, unspecified: Secondary | ICD-10-CM

## 2022-02-24 DIAGNOSIS — Z111 Encounter for screening for respiratory tuberculosis: Secondary | ICD-10-CM

## 2022-02-24 DIAGNOSIS — E785 Hyperlipidemia, unspecified: Secondary | ICD-10-CM

## 2022-03-05 ENCOUNTER — Encounter: Payer: BC Managed Care – PPO | Admitting: Internal Medicine

## 2022-04-05 DIAGNOSIS — H3561 Retinal hemorrhage, right eye: Secondary | ICD-10-CM | POA: Diagnosis not present

## 2022-04-11 DIAGNOSIS — F410 Panic disorder [episodic paroxysmal anxiety] without agoraphobia: Secondary | ICD-10-CM | POA: Diagnosis not present

## 2022-04-11 DIAGNOSIS — F331 Major depressive disorder, recurrent, moderate: Secondary | ICD-10-CM | POA: Diagnosis not present

## 2022-04-11 DIAGNOSIS — F411 Generalized anxiety disorder: Secondary | ICD-10-CM | POA: Diagnosis not present

## 2022-04-25 ENCOUNTER — Ambulatory Visit (INDEPENDENT_AMBULATORY_CARE_PROVIDER_SITE_OTHER): Payer: BC Managed Care – PPO | Admitting: Internal Medicine

## 2022-04-25 ENCOUNTER — Encounter: Payer: Self-pay | Admitting: Internal Medicine

## 2022-04-25 VITALS — BP 120/84 | HR 80 | Temp 98.0°F | Ht 60.0 in | Wt 150.4 lb

## 2022-04-25 DIAGNOSIS — E538 Deficiency of other specified B group vitamins: Secondary | ICD-10-CM

## 2022-04-25 DIAGNOSIS — Z1159 Encounter for screening for other viral diseases: Secondary | ICD-10-CM | POA: Diagnosis not present

## 2022-04-25 DIAGNOSIS — J302 Other seasonal allergic rhinitis: Secondary | ICD-10-CM

## 2022-04-25 DIAGNOSIS — Z114 Encounter for screening for human immunodeficiency virus [HIV]: Secondary | ICD-10-CM

## 2022-04-25 DIAGNOSIS — E039 Hypothyroidism, unspecified: Secondary | ICD-10-CM

## 2022-04-25 DIAGNOSIS — Z0000 Encounter for general adult medical examination without abnormal findings: Secondary | ICD-10-CM

## 2022-04-25 DIAGNOSIS — E559 Vitamin D deficiency, unspecified: Secondary | ICD-10-CM

## 2022-04-25 DIAGNOSIS — E785 Hyperlipidemia, unspecified: Secondary | ICD-10-CM | POA: Diagnosis not present

## 2022-04-25 DIAGNOSIS — G4733 Obstructive sleep apnea (adult) (pediatric): Secondary | ICD-10-CM

## 2022-04-25 MED ORDER — CETIRIZINE HCL 10 MG PO TABS
10.0000 mg | ORAL_TABLET | Freq: Every day | ORAL | 1 refills | Status: DC
Start: 2022-04-25 — End: 2023-03-10

## 2022-04-25 NOTE — Progress Notes (Signed)
Established Patient Office Visit     CC/Reason for Visit: Annual preventive exam, follow-up chronic conditions and discuss acute concerns  HPI: Sarah Wagner is a 62 y.o. female who is coming in today for the above mentioned reasons. Past Medical History is significant for: Hypothyroidism, depression, hyperlipidemia, obstructive sleep apnea, vitamin D and B12 deficiency.  She has routine eye and dental care, she is overdue for Tdap.  All cancer screening is up-to-date.  She no longer does Pap smears due to her history of complete hysterectomy.  She has been diagnosed with sleep apnea.  She has not been treated in years.  She is having increased difficulty with this.  Also lots of seasonal allergies with congestion, postnasal drip.   Past Medical/Surgical History: Past Medical History:  Diagnosis Date   Allergy    Anxiety    Depression    Headache(784.0)    Hyperlipidemia    OSA (obstructive sleep apnea) 09/10/2017   Thyroid disease     Past Surgical History:  Procedure Laterality Date   ABDOMINAL HYSTERECTOMY     HERNIA REPAIR      Social History:  reports that she has never smoked. She has never used smokeless tobacco. She reports that she does not drink alcohol and does not use drugs.  Allergies: Allergies  Allergen Reactions   Sulfacetamide Sodium     Family History:  Family History  Problem Relation Age of Onset   Cancer Mother        ovarian   Parkinsonism Mother    Depression Mother    Anxiety disorder Mother    Cancer Father        brain   Diabetes Father    Crohn's disease Father    Graves' disease Father    Hyperlipidemia Father    Depression Father    Anxiety disorder Father    Crohn's disease Sister      Current Outpatient Medications:    buPROPion (WELLBUTRIN XL) 300 MG 24 hr tablet, Take 300 mg by mouth daily., Disp: , Rfl:    cetirizine (ZYRTEC) 10 MG tablet, Take 1 tablet (10 mg total) by mouth daily., Disp: 90 tablet, Rfl: 1    citalopram (CELEXA) 40 MG tablet, Take one and half tabs daily (Patient taking differently: Take one tab daily), Disp: 30 tablet, Rfl: 2   Clocortolone Pivalate (CLODERM) 0.1 % cream, Apply 1 application topically 2 (two) times daily., Disp: 30 g, Rfl: 0   levothyroxine (SYNTHROID) 50 MCG tablet, TAKE 1 TABLET(50 MCG) BY MOUTH DAILY, Disp: 90 tablet, Rfl: 0   MIEBO 1.338 GM/ML SOLN, Apply to eye., Disp: , Rfl:    Omega-3 Fatty Acids (FISH OIL) 1000 MG CAPS, Take by mouth., Disp: , Rfl:    simvastatin (ZOCOR) 20 MG tablet, TAKE 1 TABLET(20 MG) BY MOUTH AT BEDTIME, Disp: 90 tablet, Rfl: 0  Review of Systems:  Negative unless indicated in HPI.   Physical Exam: Vitals:   04/25/22 1256  BP: 120/84  Pulse: 80  Temp: 98 F (36.7 C)  TempSrc: Oral  SpO2: 97%  Weight: 150 lb 6.4 oz (68.2 kg)  Height: 5' (1.524 m)    Body mass index is 29.37 kg/m.   Physical Exam Vitals reviewed.  Constitutional:      General: She is not in acute distress.    Appearance: Normal appearance. She is not ill-appearing, toxic-appearing or diaphoretic.  HENT:     Head: Normocephalic.     Right Ear: Tympanic  membrane, ear canal and external ear normal. There is no impacted cerumen.     Left Ear: Tympanic membrane, ear canal and external ear normal. There is no impacted cerumen.     Nose: Nose normal.     Mouth/Throat:     Mouth: Mucous membranes are moist.     Pharynx: Oropharynx is clear. No oropharyngeal exudate or posterior oropharyngeal erythema.  Eyes:     General: No scleral icterus.       Right eye: No discharge.        Left eye: No discharge.     Conjunctiva/sclera: Conjunctivae normal.     Pupils: Pupils are equal, round, and reactive to light.  Neck:     Vascular: No carotid bruit.  Cardiovascular:     Rate and Rhythm: Normal rate and regular rhythm.     Pulses: Normal pulses.     Heart sounds: Normal heart sounds.  Pulmonary:     Effort: Pulmonary effort is normal. No respiratory  distress.     Breath sounds: Normal breath sounds.  Abdominal:     General: Abdomen is flat. Bowel sounds are normal.     Palpations: Abdomen is soft.  Musculoskeletal:        General: Normal range of motion.     Cervical back: Normal range of motion.  Skin:    General: Skin is warm and dry.  Neurological:     General: No focal deficit present.     Mental Status: She is alert and oriented to person, place, and time. Mental status is at baseline.  Psychiatric:        Mood and Affect: Mood normal.        Behavior: Behavior normal.        Thought Content: Thought content normal.        Judgment: Judgment normal.     Flowsheet Row Office Visit from 04/25/2022 in [facility] [place]  PHQ-9 Total Score 2        Impression and Plan:  Encounter for preventive health examination  Acquired hypothyroidism - Plan: TSH, TSH  Dyslipidemia - Plan: CBC with Differential/Platelet, Comprehensive metabolic panel, Lipid panel, Lipid panel, Comprehensive metabolic panel, CBC with Differential/Platelet  Vitamin D deficiency - Plan: Vitamin D, 25-hydroxy, Vitamin D, 25-hydroxy  Vitamin B12 deficiency - Plan: Vitamin B12, Vitamin B12  OSA (obstructive sleep apnea) - Plan: Ambulatory referral to Neurology  Encounter for hepatitis C screening test for low risk patient - Plan: Hepatitis C antibody, Hepatitis C antibody  Encounter for screening for HIV - Plan: HIV Antibody (routine testing w rflx), HIV Antibody (routine testing w rflx)  Seasonal allergies - Plan: cetirizine (ZYRTEC) 10 MG tablet  -Recommend routine eye and dental care. -Healthy lifestyle discussed in detail. -Labs to be updated today. -Prostate cancer screening: N/A Health Maintenance  Topic Date Due   HIV Screening  Never done   Hepatitis C Screening: USPSTF Recommendation to screen - Ages 30-79 yo.  Never done   Pap Smear  Never done   COVID-19 Vaccine (6 - 2023-24 season) 09/14/2021   Flu Shot   08/15/2022   DTaP/Tdap/Td vaccine (2 - Td or Tdap) 09/03/2022   Mammogram  09/20/2023   Colon Cancer Screening  08/14/2029   Zoster (Shingles) Vaccine  Completed   HPV Vaccine  Aged Out     -Declines Tdap today due to upcoming vacation plans. -For her seasonal allergies have prescribed cetirizine daily as well as Mucinex OTC. -Referral to  neurology given her history of sleep apnea that is currently untreated.     Chaya Jan, MD [facility]

## 2022-04-26 LAB — LIPID PANEL
Cholesterol: 188 mg/dL (ref 0–200)
HDL: 53 mg/dL (ref >39.00)
LDL Cholesterol: 110 mg/dL — ABNORMAL HIGH (ref 0–99)
NonHDL: 134.6
Total CHOL/HDL Ratio: 4
Triglycerides: 124 mg/dL (ref 0.0–149.0)
VLDL: 24.8 mg/dL (ref 0.0–40.0)

## 2022-04-26 LAB — CBC WITH DIFFERENTIAL/PLATELET
Basophils Absolute: 0.1 10*3/uL (ref 0.0–0.1)
Basophils Relative: 1.3 % (ref 0.0–3.0)
Eosinophils Absolute: 0.3 10*3/uL (ref 0.0–0.7)
Eosinophils Relative: 3.2 % (ref 0.0–5.0)
HCT: 41 % (ref 36.0–46.0)
Hemoglobin: 13.7 g/dL (ref 12.0–15.0)
Lymphocytes Relative: 23.1 % (ref 12.0–46.0)
Lymphs Abs: 1.8 10*3/uL (ref 0.7–4.0)
MCHC: 33.3 g/dL (ref 30.0–36.0)
MCV: 87.3 fl (ref 78.0–100.0)
Monocytes Absolute: 0.6 10*3/uL (ref 0.1–1.0)
Monocytes Relative: 7.2 % (ref 3.0–12.0)
Neutro Abs: 5.2 10*3/uL (ref 1.4–7.7)
Neutrophils Relative %: 65.2 % (ref 43.0–77.0)
Platelets: 325 10*3/uL (ref 150.0–400.0)
RBC: 4.7 Mil/uL (ref 3.87–5.11)
RDW: 14.1 % (ref 11.5–15.5)
WBC: 7.9 10*3/uL (ref 4.0–10.5)

## 2022-04-26 LAB — COMPREHENSIVE METABOLIC PANEL
ALT: 10 U/L (ref 0–35)
AST: 11 U/L (ref 0–37)
Albumin: 4.4 g/dL (ref 3.5–5.2)
Alkaline Phosphatase: 69 U/L (ref 39–117)
BUN: 10 mg/dL (ref 6–23)
CO2: 29 mEq/L (ref 19–32)
Calcium: 9.5 mg/dL (ref 8.4–10.5)
Chloride: 102 mEq/L (ref 96–112)
Creatinine, Ser: 0.94 mg/dL (ref 0.40–1.20)
GFR: 64.71 mL/min (ref >60.00)
Glucose, Bld: 84 mg/dL (ref 70–99)
Potassium: 4.1 mEq/L (ref 3.5–5.1)
Sodium: 139 mEq/L (ref 135–145)
Total Bilirubin: 0.4 mg/dL (ref 0.2–1.2)
Total Protein: 7.3 g/dL (ref 6.0–8.3)

## 2022-04-26 LAB — VITAMIN D 25 HYDROXY (VIT D DEFICIENCY, FRACTURES): VITD: 34.28 ng/mL (ref 30.00–100.00)

## 2022-04-26 LAB — VITAMIN B12: Vitamin B-12: 275 pg/mL (ref 211–911)

## 2022-04-26 LAB — TSH: TSH: 3.2 u[IU]/mL (ref 0.35–5.50)

## 2022-04-26 LAB — HEPATITIS C ANTIBODY: Hepatitis C Ab: NONREACTIVE

## 2022-04-26 LAB — HIV ANTIBODY (ROUTINE TESTING W REFLEX): HIV 1&2 Ab, 4th Generation: NONREACTIVE

## 2022-05-17 DIAGNOSIS — H16223 Keratoconjunctivitis sicca, not specified as Sjogren's, bilateral: Secondary | ICD-10-CM | POA: Diagnosis not present

## 2022-05-17 DIAGNOSIS — H02882 Meibomian gland dysfunction right lower eyelid: Secondary | ICD-10-CM | POA: Diagnosis not present

## 2022-05-17 DIAGNOSIS — H02885 Meibomian gland dysfunction left lower eyelid: Secondary | ICD-10-CM | POA: Diagnosis not present

## 2022-05-23 ENCOUNTER — Other Ambulatory Visit: Payer: Self-pay | Admitting: Internal Medicine

## 2022-05-23 DIAGNOSIS — E039 Hypothyroidism, unspecified: Secondary | ICD-10-CM

## 2022-05-23 DIAGNOSIS — E785 Hyperlipidemia, unspecified: Secondary | ICD-10-CM

## 2022-07-04 DIAGNOSIS — F411 Generalized anxiety disorder: Secondary | ICD-10-CM | POA: Diagnosis not present

## 2022-07-04 DIAGNOSIS — F410 Panic disorder [episodic paroxysmal anxiety] without agoraphobia: Secondary | ICD-10-CM | POA: Diagnosis not present

## 2022-07-04 DIAGNOSIS — F331 Major depressive disorder, recurrent, moderate: Secondary | ICD-10-CM | POA: Diagnosis not present

## 2022-10-02 DIAGNOSIS — F331 Major depressive disorder, recurrent, moderate: Secondary | ICD-10-CM | POA: Diagnosis not present

## 2022-10-02 DIAGNOSIS — F411 Generalized anxiety disorder: Secondary | ICD-10-CM | POA: Diagnosis not present

## 2022-10-02 DIAGNOSIS — F410 Panic disorder [episodic paroxysmal anxiety] without agoraphobia: Secondary | ICD-10-CM | POA: Diagnosis not present

## 2022-10-03 ENCOUNTER — Ambulatory Visit (HOSPITAL_BASED_OUTPATIENT_CLINIC_OR_DEPARTMENT_OTHER)
Admission: RE | Admit: 2022-10-03 | Discharge: 2022-10-03 | Disposition: A | Discharge status: Home / Self Care | Payer: BC Managed Care – PPO | Source: Ambulatory Visit | Attending: Internal Medicine | Admitting: Internal Medicine

## 2022-10-03 DIAGNOSIS — Z1231 Encounter for screening mammogram for malignant neoplasm of breast: Secondary | ICD-10-CM | POA: Insufficient documentation

## 2022-11-21 ENCOUNTER — Other Ambulatory Visit: Payer: Self-pay | Admitting: Internal Medicine

## 2022-11-21 DIAGNOSIS — E039 Hypothyroidism, unspecified: Secondary | ICD-10-CM

## 2022-11-21 DIAGNOSIS — E785 Hyperlipidemia, unspecified: Secondary | ICD-10-CM

## 2022-12-30 DIAGNOSIS — F411 Generalized anxiety disorder: Secondary | ICD-10-CM | POA: Diagnosis not present

## 2022-12-30 DIAGNOSIS — F331 Major depressive disorder, recurrent, moderate: Secondary | ICD-10-CM | POA: Diagnosis not present

## 2022-12-30 DIAGNOSIS — F410 Panic disorder [episodic paroxysmal anxiety] without agoraphobia: Secondary | ICD-10-CM | POA: Diagnosis not present

## 2023-01-10 ENCOUNTER — Encounter: Payer: Self-pay | Admitting: Internal Medicine

## 2023-03-10 ENCOUNTER — Encounter: Payer: Self-pay | Admitting: Internal Medicine

## 2023-03-10 ENCOUNTER — Ambulatory Visit (INDEPENDENT_AMBULATORY_CARE_PROVIDER_SITE_OTHER): Payer: BC Managed Care – PPO | Admitting: Internal Medicine

## 2023-03-10 VITALS — BP 118/78 | HR 88 | Temp 98.1°F | Wt 153.8 lb

## 2023-03-10 DIAGNOSIS — M25561 Pain in right knee: Secondary | ICD-10-CM | POA: Diagnosis not present

## 2023-03-10 DIAGNOSIS — G8929 Other chronic pain: Secondary | ICD-10-CM | POA: Diagnosis not present

## 2023-03-10 NOTE — Progress Notes (Signed)
 Established Patient Office Visit     CC/Reason for Visit: Right knee pain  HPI: Sarah Wagner is a 63 y.o. female who is coming in today for the above mentioned reasons.  Knee pain has been getting worse over the past month.  No particular injury that she can recall.  She has tried changing her shoes without relief.  The knee will ache at nighttime.  It seems more medial.  She says 30 years ago she had a septic arthritis of the knee.  She has already tried bracing, icing and as needed ibuprofen and does not seem to be helping.   Past Medical/Surgical History: Past Medical History:  Diagnosis Date   Allergy    Anxiety    Depression    Headache(784.0)    Hyperlipidemia    OSA (obstructive sleep apnea) 09/10/2017   Thyroid disease     Past Surgical History:  Procedure Laterality Date   ABDOMINAL HYSTERECTOMY     HERNIA REPAIR      Social History:  reports that she has never smoked. She has never used smokeless tobacco. She reports that she does not drink alcohol and does not use drugs.  Allergies: Allergies  Allergen Reactions   Sulfacetamide Sodium     Family History:  Family History  Problem Relation Age of Onset   Cancer Mother        ovarian   Parkinsonism Mother    Depression Mother    Anxiety disorder Mother    Cancer Father        brain   Diabetes Father    Crohn's disease Father    Graves' disease Father    Hyperlipidemia Father    Depression Father    Anxiety disorder Father    Crohn's disease Sister      Current Outpatient Medications:    buPROPion (WELLBUTRIN XL) 300 MG 24 hr tablet, Take 300 mg by mouth daily., Disp: , Rfl:    Clocortolone Pivalate (CLODERM) 0.1 % cream, Apply 1 application topically 2 (two) times daily., Disp: 30 g, Rfl: 0   levothyroxine (SYNTHROID) 50 MCG tablet, TAKE 1 TABLET(50 MCG) BY MOUTH DAILY, Disp: 90 tablet, Rfl: 1   MIEBO 1.338 GM/ML SOLN, Apply to eye., Disp: , Rfl:    simvastatin (ZOCOR) 20 MG tablet,  TAKE 1 TABLET(20 MG) BY MOUTH AT BEDTIME, Disp: 90 tablet, Rfl: 1  Review of Systems:  Negative unless indicated in HPI.   Physical Exam: Vitals:   03/10/23 1535  BP: 118/78  Pulse: 88  Temp: 98.1 F (36.7 C)  TempSrc: Oral  SpO2: 98%  Weight: 153 lb 12.8 oz (69.8 kg)    Body mass index is 30.04 kg/m.   Physical Exam Musculoskeletal:     Right knee: No swelling, deformity, effusion, bony tenderness or crepitus. No LCL laxity, MCL laxity, ACL laxity or PCL laxity. Normal alignment and normal meniscus.     Instability Tests: Anterior drawer test negative. Posterior drawer test negative. Medial McMurray test negative and lateral McMurray test negative.      Impression and Plan:  Chronic pain of right knee -     Ambulatory referral to Sports Medicine   -Knee exam normal.  She has failed initial conservative management with bracing, icing and as needed NSAIDs.  I will place referral to sports medicine for further evaluation.  Time spent:30 minutes reviewing chart, interviewing and examining patient and formulating plan of care.     Chaya Jan, MD [facility]

## 2023-03-14 ENCOUNTER — Ambulatory Visit (INDEPENDENT_AMBULATORY_CARE_PROVIDER_SITE_OTHER): Payer: BC Managed Care – PPO

## 2023-03-14 ENCOUNTER — Other Ambulatory Visit: Payer: Self-pay

## 2023-03-14 ENCOUNTER — Ambulatory Visit: Payer: BC Managed Care – PPO | Admitting: Family Medicine

## 2023-03-14 ENCOUNTER — Encounter: Payer: Self-pay | Admitting: Family Medicine

## 2023-03-14 VITALS — BP 118/80 | HR 91 | Ht 60.0 in | Wt 154.0 lb

## 2023-03-14 DIAGNOSIS — M25461 Effusion, right knee: Secondary | ICD-10-CM | POA: Diagnosis not present

## 2023-03-14 DIAGNOSIS — M1711 Unilateral primary osteoarthritis, right knee: Secondary | ICD-10-CM | POA: Diagnosis not present

## 2023-03-14 DIAGNOSIS — M25561 Pain in right knee: Secondary | ICD-10-CM | POA: Diagnosis not present

## 2023-03-14 DIAGNOSIS — G8929 Other chronic pain: Secondary | ICD-10-CM

## 2023-03-14 NOTE — Patient Instructions (Addendum)
 Thank you for coming in today.  Please get an Xray today before you leave  You received an injection today. Seek immediate medical attention if the joint becomes red, extremely painful, or is oozing fluid.   Please use Voltaren gel (Generic Diclofenac Gel) up to 4x daily for pain as needed.  This is available over-the-counter as both the name brand Voltaren gel and the generic diclofenac gel.   Check back as needed

## 2023-03-14 NOTE — Progress Notes (Signed)
 I, Stevenson Clinch, CMA acting as a scribe for Clementeen Graham, MD.  Sarah Wagner is a 63 y.o. female who presents to [facility] at [facility] today for R knee pain worsening over the last month. No injury. Pt locates pain to medical aspect of the knee. Denies injury. Pain radiating into the lower leg at times. Sx can be constant at times. Has tried bracing but this exacerbated sx. Sx causing night disturbance, sharp stabbing pain.   R Knee swelling: no  Mechanical symptoms: no Aggravates: sleeping with legs crossed, stairs Treatments tried: changing shoes, bracing, ice, IBU  Pertinent review of systems: No fevers or chills  Relevant historical information: Sleep apnea.  Depression.   Exam:  BP 118/80   Pulse 91   Ht 5' (1.524 m)   Wt 154 lb (69.9 kg)   SpO2 97%   BMI 30.08 kg/m  General: Well Developed, well nourished, and in no acute distress.   MSK: Right knee mild effusion normal-appearing otherwise.  Normal motion.  Tender palpation medial joint line for Stable ligamentous exam. Intact strength.    Lab and Radiology Results  Procedure: Real-time Ultrasound Guided Injection of right knee joint superior lateral patella space Device: Philips Affiniti 50G/GE Logiq Images permanently stored and available for review in PACS Verbal informed consent obtained.  Discussed risks and benefits of procedure. Warned about infection, bleeding, hyperglycemia damage to structures among others. Patient expresses understanding and agreement Time-out conducted.   Noted no overlying erythema, induration, or other signs of local infection.   Skin prepped in a sterile fashion.   Local anesthesia: Topical Ethyl chloride.   With sterile technique and under real time ultrasound guidance: 40 mg of Kenalog and 2 mL of Marcaine injected into knee joint. Fluid seen entering the joint capsule.   Completed without difficulty   Pain immediately resolved suggesting accurate  placement of the medication.   Advised to call if fevers/chills, erythema, induration, drainage, or persistent bleeding.   Images permanently stored and available for review in the ultrasound unit.  Impression: Technically successful ultrasound guided injection.   X-ray images right knee obtained today personally and independently interpreted. Mild medial lateral and patellofemoral DJD.  No acute fractures are visible. Await formal radiology review     Assessment and Plan: 63 y.o. female with chronic right knee pain with acute worsening recently.  Pain due to exacerbation of DJD.  She may have a degenerative meniscus tear as well.  Plan for steroid injection.  Work on Dance movement psychotherapist when feeling better.  Continue Tylenol.  Recheck if not improved consider MRI if not better. PDMP not reviewed this encounter. Orders Placed This Encounter  Procedures   [place] LIMITED JOINT SPACE STRUCTURES LOW RIGHT(NO LINKED CHARGES)    Reason for Exam (SYMPTOM  OR DIAGNOSIS REQUIRED):   right knee pain    Preferred imaging location?:   [facility] Knee AP/LAT W/[place] Right    Standing Status:   Future    Number of Occurrences:   1    Expiration Date:   04/11/2023    Reason for Exam (SYMPTOM  OR DIAGNOSIS REQUIRED):   right knee pain    Preferred imaging location?:   [facility]   No orders of the defined types were placed in this encounter.    Discussed warning signs or symptoms. Please see discharge instructions. Patient expresses understanding.   The above documentation has been reviewed and is accurate and complete  Clementeen Graham, M.D.

## 2023-03-24 DIAGNOSIS — F411 Generalized anxiety disorder: Secondary | ICD-10-CM | POA: Diagnosis not present

## 2023-03-24 DIAGNOSIS — F331 Major depressive disorder, recurrent, moderate: Secondary | ICD-10-CM | POA: Diagnosis not present

## 2023-03-24 DIAGNOSIS — F410 Panic disorder [episodic paroxysmal anxiety] without agoraphobia: Secondary | ICD-10-CM | POA: Diagnosis not present

## 2023-04-04 ENCOUNTER — Encounter: Payer: Self-pay | Admitting: Family Medicine

## 2023-04-04 NOTE — Progress Notes (Signed)
 Right knee x-ray shows arthritis worse at the medial inside part of the knee.

## 2023-04-17 IMAGING — MG DIGITAL SCREENING BILAT W/ CAD
4 series · 4 of 4 positions shown · non-contrast
Comparison: Previous exam(s).

CLINICAL DATA: Screening.

EXAM:
DIGITAL SCREENING BILATERAL MAMMOGRAM WITH CAD
TECHNIQUE: Bilateral screening digital craniocaudal and mediolateral oblique
mammograms were obtained. The images were evaluated with
computer-aided detection.

[L CC]
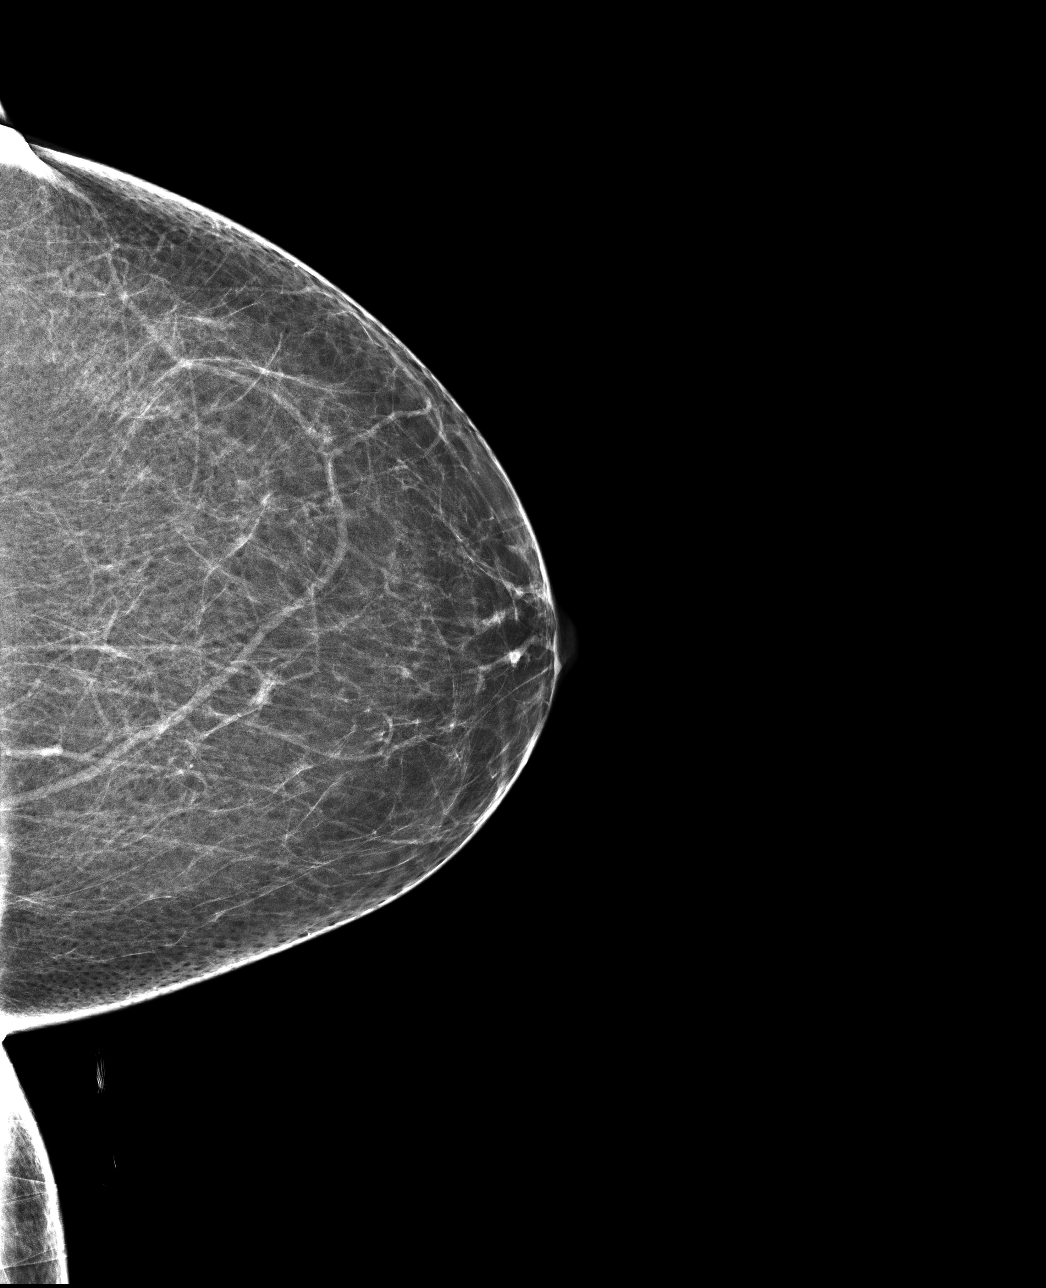

[L MLO]
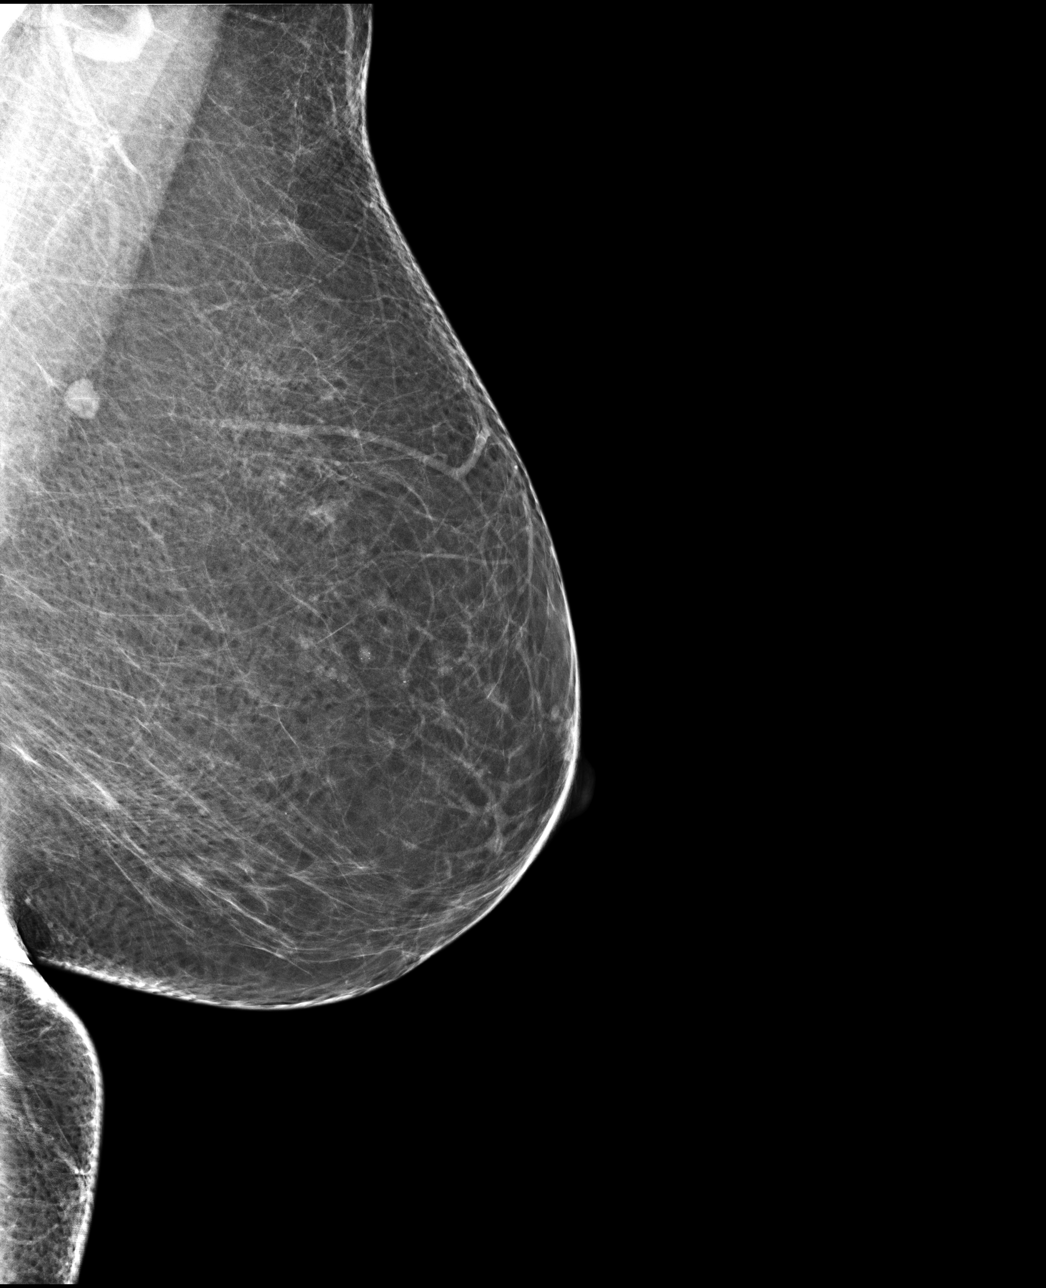

[R CC]
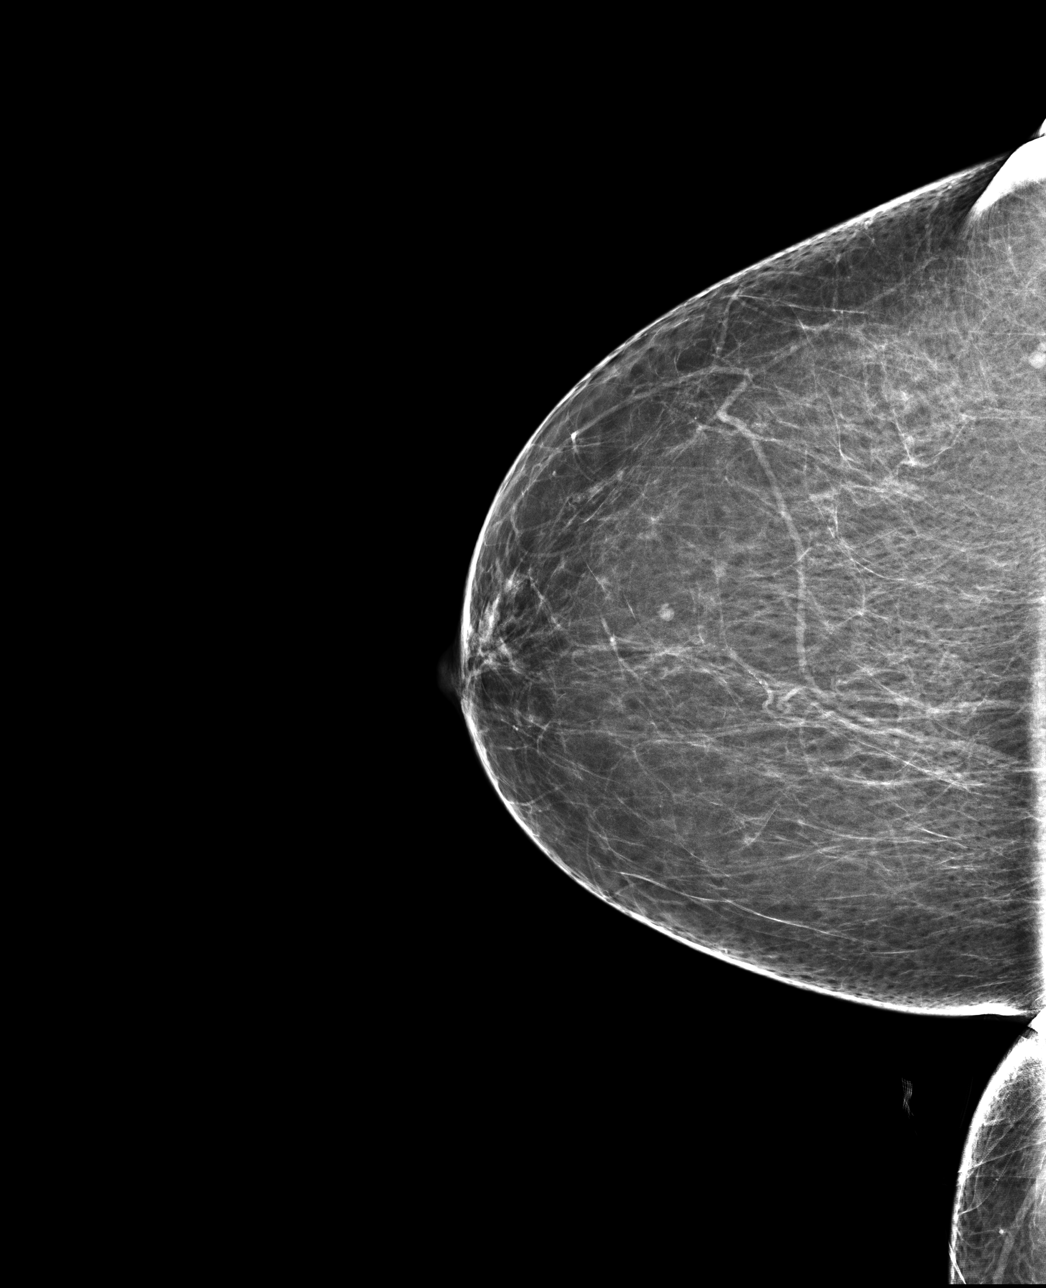

[R MLO]
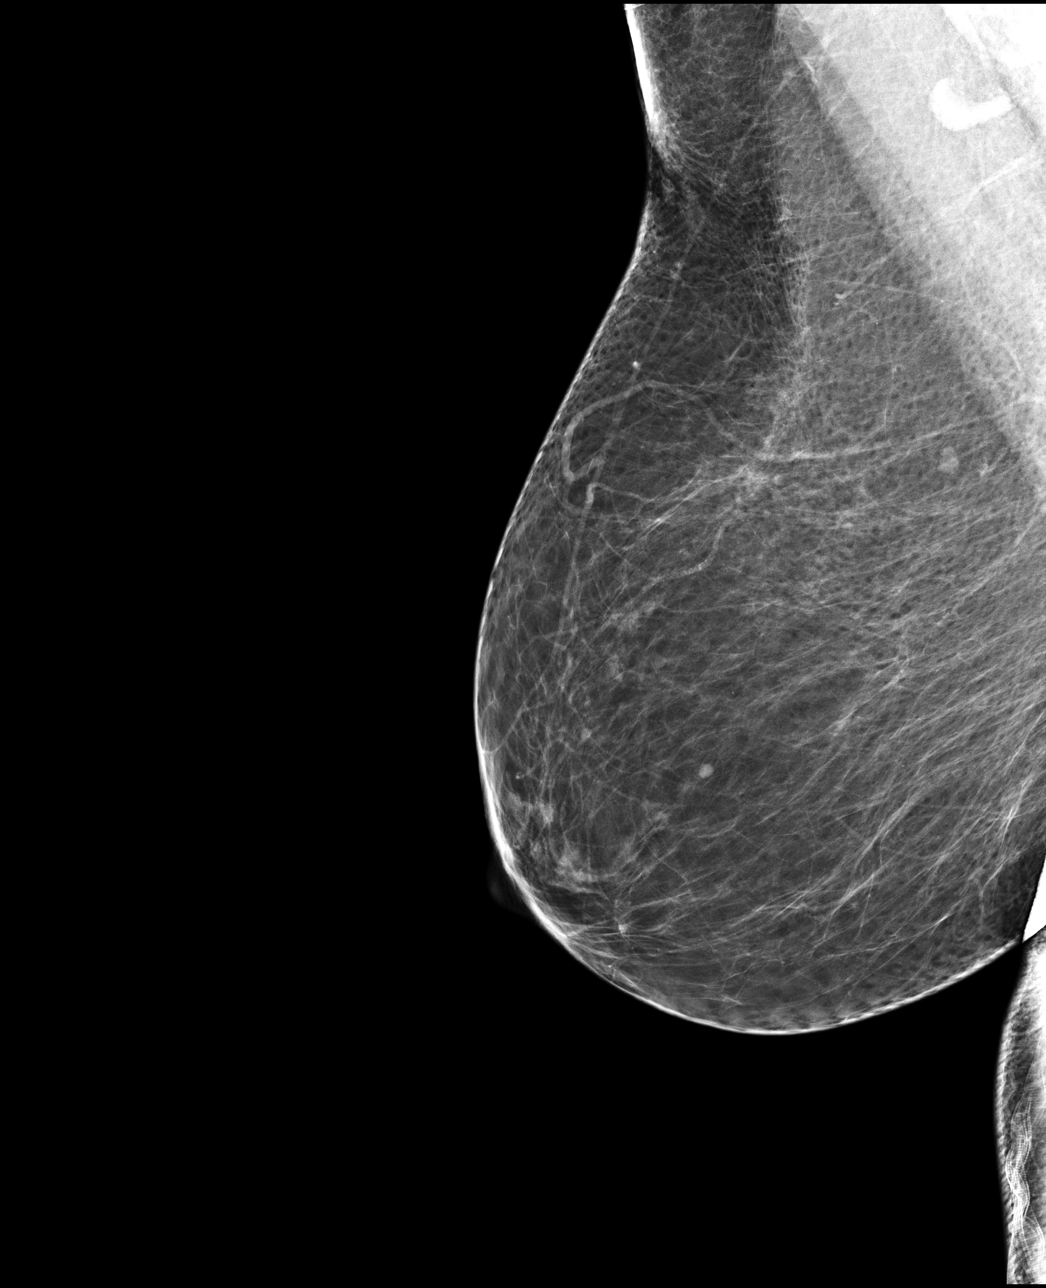

[4 of 4 positions shown; findings below may reference images not displayed]

ACR Breast Density Category b: There are scattered areas of
fibroglandular density.
FINDINGS: There are no findings suspicious for malignancy. The images were
evaluated with computer-aided detection.
IMPRESSION: No mammographic evidence of malignancy. A result letter of this
screening mammogram will be mailed directly to the patient.

RECOMMENDATION:
Screening mammogram in one year. (Code:[id number])

BI-RADS CATEGORY  1: Negative.

## 2023-05-23 ENCOUNTER — Other Ambulatory Visit: Payer: Self-pay | Admitting: Internal Medicine

## 2023-05-23 DIAGNOSIS — E039 Hypothyroidism, unspecified: Secondary | ICD-10-CM

## 2023-05-23 DIAGNOSIS — E785 Hyperlipidemia, unspecified: Secondary | ICD-10-CM

## 2023-06-11 ENCOUNTER — Ambulatory Visit (INDEPENDENT_AMBULATORY_CARE_PROVIDER_SITE_OTHER): Payer: BC Managed Care – PPO | Admitting: Family Medicine

## 2023-06-11 ENCOUNTER — Encounter: Payer: Self-pay | Admitting: Family Medicine

## 2023-06-11 ENCOUNTER — Other Ambulatory Visit: Payer: Self-pay

## 2023-06-11 ENCOUNTER — Other Ambulatory Visit: Payer: Self-pay | Admitting: Internal Medicine

## 2023-06-11 VITALS — BP 116/82 | HR 85 | Ht 60.0 in | Wt 152.0 lb

## 2023-06-11 DIAGNOSIS — G8929 Other chronic pain: Secondary | ICD-10-CM

## 2023-06-11 DIAGNOSIS — R21 Rash and other nonspecific skin eruption: Secondary | ICD-10-CM

## 2023-06-11 DIAGNOSIS — E039 Hypothyroidism, unspecified: Secondary | ICD-10-CM

## 2023-06-11 DIAGNOSIS — M25561 Pain in right knee: Secondary | ICD-10-CM | POA: Diagnosis not present

## 2023-06-11 DIAGNOSIS — E785 Hyperlipidemia, unspecified: Secondary | ICD-10-CM

## 2023-06-11 NOTE — Progress Notes (Signed)
   I, [name], CMA acting as a scribe for Garlan Juniper, MD.  Sarah Wagner is a 63 y.o. female who presents to [facility] at [facility] today for 27-month f/u R knee pain. Pt was last seen by Dr. Alease Hunter on 03/14/23 and was given a R knee steroid injection and advised to cont Tylenol , and work on Dance movement psychotherapist.  Today, pt reports falling this morning, tripped over a baby gate landing on the left knee. The knee is sore, ambulating with a limb. Notes significant improvement of right knee pain after injection. Notes exacerbation of sx over the past week. Taking IBU and using ice prn. Sx flare with prolonged ambulation. Had some swelling in the right knee a few weeks ago, has resolved. Mechanical sx with descending stairs.   Dx imaging: 03/14/23 R knee XR  Pertinent review of systems: No fevers or chills  Relevant historical information: Sleep apnea.   Exam:  BP 116/82   Pulse 85   Ht 5' (1.524 m)   Wt 152 lb (68.9 kg)   SpO2 98%   BMI 29.69 kg/m  General: Well Developed, well nourished, and in no acute distress.   MSK: Right knee normal-appearing normal motion.  Mildly tender palpation intact strength.      Assessment and Plan: 63 y.o. female with right chronic knee pain.  Pain is starting to return.  She is traveling to [place] on July 1.  Plan to schedule on June 17 for steroid injection.   PDMP not reviewed this encounter. No orders of the defined types were placed in this encounter.  No orders of the defined types were placed in this encounter.    Discussed warning signs or symptoms. Please see discharge instructions. Patient expresses understanding.   The above documentation has been reviewed and is accurate and complete Garlan Juniper, M.D.

## 2023-06-11 NOTE — Patient Instructions (Addendum)
 Thank you for coming in today.   Schedule a return visit around June 17th for an injection  Fleet Feet

## 2023-06-11 NOTE — Telephone Encounter (Unsigned)
 Copied from CRM [id number]. Topic: Clinical - Medication Refill >> Jun 11, 2023 12:43 PM Shardie S wrote: Medication: simvastatin  (ZOCOR ) 20 MG tablet Clocortolone  Pivalate (CLODERM ) 0.1 % cream levothyroxine  (SYNTHROID ) 50 MCG tablet Patient request a 90 day supply  Has the patient contacted their pharmacy? Yes (Agent: If no, request that the patient contact the pharmacy for the refill. If patient does not wish to contact the pharmacy document the reason why and proceed with request.) (Agent: If yes, when and what did the pharmacy advise?)  This is the patient's preferred pharmacy:  [id number] - [place], [place] - [address] [place] [address] Phone: [phone] Fax: [phone]  Is this the correct pharmacy for this prescription? Yes If no, delete pharmacy and type the correct one.   Has the prescription been filled recently? No  Is the patient out of the medication? No  Has the patient been seen for an appointment in the last year OR does the patient have an upcoming appointment? Yes  Can we respond through MyChart? Yes  Agent: Please be advised that Rx refills may take up to 3 business days. We ask that you follow-up with your pharmacy.

## 2023-06-12 MED ORDER — LEVOTHYROXINE SODIUM 50 MCG PO TABS: 50.0000 ug | ORAL_TABLET | Freq: Every day | ORAL | 0 refills | Status: DC

## 2023-06-12 MED ORDER — CLOCORTOLONE PIVALATE 0.1 % EX CREA: 1.0000 | TOPICAL_CREAM | Freq: Two times a day (BID) | CUTANEOUS | 0 refills | Status: AC

## 2023-06-12 MED ORDER — SIMVASTATIN 20 MG PO TABS: 20.0000 mg | ORAL_TABLET | Freq: Every day | ORAL | 0 refills | Status: DC

## 2023-06-16 ENCOUNTER — Telehealth: Payer: Self-pay | Admitting: *Deleted

## 2023-06-16 DIAGNOSIS — E039 Hypothyroidism, unspecified: Secondary | ICD-10-CM

## 2023-06-16 DIAGNOSIS — E785 Hyperlipidemia, unspecified: Secondary | ICD-10-CM

## 2023-06-16 MED ORDER — SIMVASTATIN 20 MG PO TABS: 20.0000 mg | ORAL_TABLET | Freq: Every day | ORAL | 0 refills | Status: DC

## 2023-06-16 MED ORDER — LEVOTHYROXINE SODIUM 50 MCG PO TABS: 50.0000 ug | ORAL_TABLET | Freq: Every day | ORAL | 0 refills | Status: DC

## 2023-06-16 NOTE — Telephone Encounter (Signed)
 Refill sent.

## 2023-06-16 NOTE — Telephone Encounter (Signed)
 Copied from CRM [id number]. Topic: Clinical - Medication Question >> Jun 16, 2023  9:49 AM Bambi Bonine D wrote: Reason for CRM: Pt stated that she recently requested a medication refill and requested a 90 day supply. Pt stated that she only received a 30 day supply and would like to know if Dr.Hernandez could send a 60 day supply to the pharmacy. Pt stated that she wants to prevent having to call in every month for the refill. Pt would like a call back with an update on the medication request.

## 2023-06-17 DIAGNOSIS — F410 Panic disorder [episodic paroxysmal anxiety] without agoraphobia: Secondary | ICD-10-CM | POA: Diagnosis not present

## 2023-06-17 DIAGNOSIS — F331 Major depressive disorder, recurrent, moderate: Secondary | ICD-10-CM | POA: Diagnosis not present

## 2023-06-17 DIAGNOSIS — F411 Generalized anxiety disorder: Secondary | ICD-10-CM | POA: Diagnosis not present

## 2023-07-01 ENCOUNTER — Encounter: Payer: Self-pay | Admitting: Family Medicine

## 2023-07-01 ENCOUNTER — Ambulatory Visit: Admitting: Family Medicine

## 2023-07-01 ENCOUNTER — Other Ambulatory Visit: Payer: Self-pay

## 2023-07-01 VITALS — BP 116/82 | HR 85 | Ht 60.0 in | Wt 141.0 lb

## 2023-07-01 DIAGNOSIS — M25561 Pain in right knee: Secondary | ICD-10-CM

## 2023-07-01 DIAGNOSIS — G8929 Other chronic pain: Secondary | ICD-10-CM

## 2023-07-01 NOTE — Patient Instructions (Signed)
 Thank you for coming in today.   You received an injection today. Seek immediate medical attention if the joint becomes red, extremely painful, or is oozing fluid.   Have a fabulous trip!  See you back as needed

## 2023-07-01 NOTE — Progress Notes (Signed)
   I, Sarah Wagner, CMA acting as a scribe for Sarah Juniper, MD.  Sarah Wagner is a 63 y.o. female who presents to [facility] at [facility] today for f/u R knee pain. Last R knee steroid injection, 03/14/23.  Of note, pt is leaving for a trip to [place] in 2 weeks. Denies visible swelling. Sx exacerbated over the past week, started after changing shoes. Locates pain to peripatellar region. Notes increased popping in the knee. Taking IBU prn. Short-term relief with ice.    Dx imaging: 03/14/23 R knee XR  Pertinent review of systems: No fevers or chills  Relevant historical information: Sleep apnea.  Hypothyroidism.  Depression.   Exam:  BP 116/82   Pulse 85   Ht 5' (1.524 m)   Wt 141 lb (64 kg)   SpO2 98%   BMI 27.54 kg/m  General: Well Developed, well nourished, and in no acute distress.   MSK: Right knee mild effusion normal-appearing otherwise normal motion with crepitation.    Lab and Radiology Results  Procedure: Real-time Ultrasound Guided Injection of right knee joint superior lateral patella space Device: Philips Affiniti 50G/GE Logiq Images permanently stored and available for review in PACS Verbal informed consent obtained.  Discussed risks and benefits of procedure. Warned about infection, bleeding, hyperglycemia damage to structures among others. Patient expresses understanding and agreement Time-out conducted.   Noted no overlying erythema, induration, or other signs of local infection.   Skin prepped in a sterile fashion.   Local anesthesia: Topical Ethyl chloride.   With sterile technique and under real time ultrasound guidance: 40 mg of Kenalog and 2 mL of Marcaine injected into knee joint. Fluid seen entering the joint capsule.   Completed without difficulty   Pain immediately resolved suggesting accurate placement of the medication.   Advised to call if fevers/chills, erythema, induration, drainage, or persistent bleeding.   Images  permanently stored and available for review in the ultrasound unit.  Impression: Technically successful ultrasound guided injection.       Assessment and Plan: 63 y.o. female with right knee pain due to exacerbation of DJD.  Plan for steroid injection today.  Check back as needed.  We talked about pain management on this upcoming trip.  She is very reluctant to consider opiates as her daughter died from opiate addiction.   PDMP not reviewed this encounter. Orders Placed This Encounter  Procedures   [place]  LIMITED JOINT SPACE STRUCTURES LOW RIGHT(NO LINKED CHARGES)    Reason for Exam (SYMPTOM  OR DIAGNOSIS REQUIRED):   right knee pain    Preferred imaging location?:   [facility]   No orders of the defined types were placed in this encounter.    Discussed warning signs or symptoms. Please see discharge instructions. Patient expresses understanding.   The above documentation has been reviewed and is accurate and complete Sarah Wagner, M.D.

## 2023-07-12 ENCOUNTER — Other Ambulatory Visit: Payer: Self-pay | Admitting: Internal Medicine

## 2023-07-12 DIAGNOSIS — E039 Hypothyroidism, unspecified: Secondary | ICD-10-CM

## 2023-07-12 DIAGNOSIS — E785 Hyperlipidemia, unspecified: Secondary | ICD-10-CM

## 2023-08-01 ENCOUNTER — Encounter: Payer: Self-pay | Admitting: Family Medicine

## 2023-08-01 ENCOUNTER — Ambulatory Visit (INDEPENDENT_AMBULATORY_CARE_PROVIDER_SITE_OTHER): Admitting: Family Medicine

## 2023-08-01 VITALS — BP 118/76 | HR 72 | Temp 98.4°F | Wt 149.0 lb

## 2023-08-01 DIAGNOSIS — A084 Viral intestinal infection, unspecified: Secondary | ICD-10-CM | POA: Diagnosis not present

## 2023-08-01 NOTE — Progress Notes (Signed)
   Subjective:    Patient ID: Sarah Wagner, female    DOB: 04/12/1960, 63 y.o.   MRN: [id number]  HPI Here for 6 days of mild abdominal cramping and diarrhea. No fever or nausea. She is drinking fluids. She tried Pepto-Bismol and Imodium. Today she feels much better, and her last stool was 24 hours ago. This started after she returned from a trip to [place] with her husband.    Review of Systems  Constitutional: Negative.   Respiratory: Negative.    Cardiovascular: Negative.   Gastrointestinal:  Positive for abdominal pain and diarrhea. Negative for abdominal distention, blood in stool, constipation, nausea and vomiting.  Genitourinary: Negative.        Objective:   Physical Exam Constitutional:      Appearance: Normal appearance.  Cardiovascular:     Rate and Rhythm: Normal rate and regular rhythm.     Pulses: Normal pulses.     Heart sounds: Normal heart sounds.  Pulmonary:     Effort: Pulmonary effort is normal.     Breath sounds: Normal breath sounds.  Abdominal:     General: Abdomen is flat. Bowel sounds are normal. There is no distension.     Palpations: Abdomen is soft. There is no mass.     Tenderness: There is no abdominal tenderness. There is no right CVA tenderness, left CVA tenderness, guarding or rebound.     Hernia: No hernia is present.  Neurological:     Mental Status: She is alert.           Assessment & Plan:  She has had a viral enteritis, and she seems to be over it now. She will follow up as needed.  Garnette Olmsted, MD

## 2023-09-03 ENCOUNTER — Other Ambulatory Visit (HOSPITAL_BASED_OUTPATIENT_CLINIC_OR_DEPARTMENT_OTHER): Payer: Self-pay | Admitting: Internal Medicine

## 2023-09-03 DIAGNOSIS — Z1231 Encounter for screening mammogram for malignant neoplasm of breast: Secondary | ICD-10-CM

## 2023-09-12 DIAGNOSIS — F411 Generalized anxiety disorder: Secondary | ICD-10-CM | POA: Diagnosis not present

## 2023-09-12 DIAGNOSIS — F331 Major depressive disorder, recurrent, moderate: Secondary | ICD-10-CM | POA: Diagnosis not present

## 2023-09-12 DIAGNOSIS — F410 Panic disorder [episodic paroxysmal anxiety] without agoraphobia: Secondary | ICD-10-CM | POA: Diagnosis not present

## 2023-10-09 ENCOUNTER — Other Ambulatory Visit: Payer: Self-pay | Admitting: Internal Medicine

## 2023-10-09 DIAGNOSIS — E785 Hyperlipidemia, unspecified: Secondary | ICD-10-CM

## 2023-10-09 DIAGNOSIS — E039 Hypothyroidism, unspecified: Secondary | ICD-10-CM

## 2023-10-13 ENCOUNTER — Ambulatory Visit (INDEPENDENT_AMBULATORY_CARE_PROVIDER_SITE_OTHER): Admitting: Internal Medicine

## 2023-10-13 ENCOUNTER — Encounter: Payer: Self-pay | Admitting: Internal Medicine

## 2023-10-13 VITALS — BP 130/80 | HR 75 | Temp 98.3°F | Ht 59.5 in | Wt 151.7 lb

## 2023-10-13 DIAGNOSIS — E785 Hyperlipidemia, unspecified: Secondary | ICD-10-CM | POA: Diagnosis not present

## 2023-10-13 DIAGNOSIS — E538 Deficiency of other specified B group vitamins: Secondary | ICD-10-CM

## 2023-10-13 DIAGNOSIS — E039 Hypothyroidism, unspecified: Secondary | ICD-10-CM

## 2023-10-13 DIAGNOSIS — Z0000 Encounter for general adult medical examination without abnormal findings: Secondary | ICD-10-CM | POA: Diagnosis not present

## 2023-10-13 DIAGNOSIS — Z23 Encounter for immunization: Secondary | ICD-10-CM | POA: Diagnosis not present

## 2023-10-13 DIAGNOSIS — E559 Vitamin D deficiency, unspecified: Secondary | ICD-10-CM | POA: Diagnosis not present

## 2023-10-13 LAB — COMPREHENSIVE METABOLIC PANEL WITH GFR
ALT: 11 U/L (ref 0–35)
AST: 13 U/L (ref 0–37)
Albumin: 4.5 g/dL (ref 3.5–5.2)
Alkaline Phosphatase: 73 U/L (ref 39–117)
BUN: 12 mg/dL (ref 6–23)
CO2: 30 meq/L (ref 19–32)
Calcium: 9.5 mg/dL (ref 8.4–10.5)
Chloride: 101 meq/L (ref 96–112)
Creatinine, Ser: 0.85 mg/dL (ref 0.40–1.20)
GFR: 72.27 mL/min (ref >60.00)
Glucose, Bld: 81 mg/dL (ref 70–99)
Potassium: 3.9 meq/L (ref 3.5–5.1)
Sodium: 138 meq/L (ref 135–145)
Total Bilirubin: 0.4 mg/dL (ref 0.2–1.2)
Total Protein: 7.1 g/dL (ref 6.0–8.3)

## 2023-10-13 LAB — CBC WITH DIFFERENTIAL/PLATELET
Basophils Absolute: 0.1 K/uL (ref 0.0–0.1)
Basophils Relative: 0.7 % (ref 0.0–3.0)
Eosinophils Absolute: 0.3 K/uL (ref 0.0–0.7)
Eosinophils Relative: 3.5 % (ref 0.0–5.0)
HCT: 40.8 % (ref 36.0–46.0)
Hemoglobin: 13.5 g/dL (ref 12.0–15.0)
Lymphocytes Relative: 24.9 % (ref 12.0–46.0)
Lymphs Abs: 2 K/uL (ref 0.7–4.0)
MCHC: 33.1 g/dL (ref 30.0–36.0)
MCV: 86 fl (ref 78.0–100.0)
Monocytes Absolute: 0.6 K/uL (ref 0.1–1.0)
Monocytes Relative: 7 % (ref 3.0–12.0)
Neutro Abs: 5.2 K/uL (ref 1.4–7.7)
Neutrophils Relative %: 63.9 % (ref 43.0–77.0)
Platelets: 330 K/uL (ref 150.0–400.0)
RBC: 4.74 Mil/uL (ref 3.87–5.11)
RDW: 13.7 % (ref 11.5–15.5)
WBC: 8.2 K/uL (ref 4.0–10.5)

## 2023-10-13 LAB — LIPID PANEL
Cholesterol: 193 mg/dL (ref 0–200)
HDL: 46.9 mg/dL (ref >39.00)
LDL Cholesterol: 113 mg/dL — ABNORMAL HIGH (ref 0–99)
NonHDL: 145.83
Total CHOL/HDL Ratio: 4
Triglycerides: 163 mg/dL — ABNORMAL HIGH (ref 0.0–149.0)
VLDL: 32.6 mg/dL (ref 0.0–40.0)

## 2023-10-13 NOTE — Addendum Note (Signed)
 Addended by: KATHRYNE MILLMAN B on: 10/13/2023 03:16 PM   Modules accepted: Orders

## 2023-10-13 NOTE — Progress Notes (Signed)
 Established Patient Office Visit     CC/Reason for Visit: Annual preventive exam  HPI: Katelin Kutsch is a 63 y.o. female who is coming in today for the above mentioned reasons. Past Medical History is significant for: Hyperlipidemia, hypothyroidism, OSA, vitamin D  and B12 deficiencies.  Feeling well without acute concerns or complaints.  Has routine eye and dental care.  Is overdue for Tdap, pneumonia, flu and COVID vaccines.  Has her mammogram scheduled for next week and had a colonoscopy in 2021.   Past Medical/Surgical History: Past Medical History:  Diagnosis Date   Allergy    Anxiety    Depression    Headache(784.0)    Hyperlipidemia    OSA (obstructive sleep apnea) 09/10/2017   Thyroid  disease     Past Surgical History:  Procedure Laterality Date   ABDOMINAL HYSTERECTOMY     HERNIA REPAIR      Social History:  reports that she has never smoked. She has never used smokeless tobacco. She reports that she does not drink alcohol and does not use drugs.  Allergies: Allergies  Allergen Reactions   Sulfacetamide Sodium     Family History:  Family History  Problem Relation Age of Onset   Cancer Mother        ovarian   Parkinsonism Mother    Depression Mother    Anxiety disorder Mother    Cancer Father        brain   Diabetes Father    Crohn's disease Father    Graves' disease Father    Hyperlipidemia Father    Depression Father    Anxiety disorder Father    Crohn's disease Sister      Current Outpatient Medications:    buPROPion (WELLBUTRIN XL) 300 MG 24 hr tablet, Take 300 mg by mouth daily., Disp: , Rfl:    citalopram  (CELEXA ) 40 MG tablet, Take 40 mg by mouth daily., Disp: , Rfl:    Clocortolone  Pivalate (CLODERM ) 0.1 % cream, Apply 1 Application topically 2 (two) times daily., Disp: 30 g, Rfl: 0   levothyroxine  (SYNTHROID ) 50 MCG tablet, TAKE 1 TABLET(50 MCG) BY MOUTH DAILY BEFORE BREAKFAST, Disp: 90 tablet, Rfl: 0   simvastatin  (ZOCOR ) 20  MG tablet, TAKE 1 TABLET(20 MG) BY MOUTH DAILY AT 6 PM, Disp: 90 tablet, Rfl: 0  Review of Systems:  Negative unless indicated in HPI.   Physical Exam: Vitals:   10/13/23 1359  BP: 130/80  Pulse: 75  Temp: 98.3 F (36.8 C)  TempSrc: Oral  SpO2: 99%  Weight: 151 lb 11.2 oz (68.8 kg)  Height: 4' 11.5 (1.511 m)    Body mass index is 30.13 kg/m.   Physical Exam Vitals reviewed.  Constitutional:      General: She is not in acute distress.    Appearance: Normal appearance. She is not ill-appearing, toxic-appearing or diaphoretic.  HENT:     Head: Normocephalic.     Right Ear: Tympanic membrane, ear canal and external ear normal. There is no impacted cerumen.     Left Ear: Tympanic membrane, ear canal and external ear normal. There is no impacted cerumen.     Nose: Nose normal.     Mouth/Throat:     Mouth: Mucous membranes are moist.     Pharynx: Oropharynx is clear. No oropharyngeal exudate or posterior oropharyngeal erythema.  Eyes:     General: No scleral icterus.       Right eye: No discharge.  Left eye: No discharge.     Conjunctiva/sclera: Conjunctivae normal.     Pupils: Pupils are equal, round, and reactive to light.  Neck:     Vascular: No carotid bruit.  Cardiovascular:     Rate and Rhythm: Normal rate and regular rhythm.     Pulses: Normal pulses.     Heart sounds: Normal heart sounds.  Pulmonary:     Effort: Pulmonary effort is normal. No respiratory distress.     Breath sounds: Normal breath sounds.  Abdominal:     General: Abdomen is flat. Bowel sounds are normal.     Palpations: Abdomen is soft.  Musculoskeletal:        General: Normal range of motion.     Cervical back: Normal range of motion.  Skin:    General: Skin is warm and dry.  Neurological:     General: No focal deficit present.     Mental Status: She is alert and oriented to person, place, and time. Mental status is at baseline.  Psychiatric:        Mood and Affect: Mood normal.         Behavior: Behavior normal.        Thought Content: Thought content normal.        Judgment: Judgment normal.      Impression and Plan:  Dyslipidemia -     CBC with Differential/Platelet; Future -     Comprehensive metabolic panel with GFR; Future -     Lipid panel; Future  Acquired hypothyroidism -     TSH; Future  Vitamin D  deficiency -     VITAMIN D  25 Hydroxy (Vit-D Deficiency, Fractures); Future  Vitamin B12 deficiency -     Vitamin B12; Future  Encounter for preventive health examination  Immunization due   -Recommend routine eye and dental care. -Healthy lifestyle discussed in detail. -Labs to be updated today. -Prostate cancer screening: N/A Health Maintenance  Topic Date Due   Pap with HPV screening  Never done   Pneumococcal Vaccine for age over 79 (1 of 1 - PCV) Never done   DTaP/Tdap/Td vaccine (2 - Td or Tdap) 09/03/2022   Flu Shot  08/15/2023   COVID-19 Vaccine (6 - 2025-26 season) 09/15/2023   Breast Cancer Screening  10/02/2024   Colon Cancer Screening  08/14/2029   Hepatitis C Screening  Completed   HIV Screening  Completed   Zoster (Shingles) Vaccine  Completed   Hepatitis B Vaccine  Aged Out   HPV Vaccine  Aged Out   Meningitis B Vaccine  Aged Out    - Flu and PCV 20 in office today, will obtain Tdap and COVID at a later date.    Tully Theophilus Andrews, MD  Primary Care at [facility]

## 2023-10-14 LAB — VITAMIN B12: Vitamin B-12: 278 pg/mL (ref 211–911)

## 2023-10-14 LAB — VITAMIN D 25 HYDROXY (VIT D DEFICIENCY, FRACTURES): VITD: 29.84 ng/mL — ABNORMAL LOW (ref 30.00–100.00)

## 2023-10-14 LAB — TSH: TSH: 2.41 u[IU]/mL (ref 0.35–5.50)

## 2023-10-17 ENCOUNTER — Encounter (HOSPITAL_BASED_OUTPATIENT_CLINIC_OR_DEPARTMENT_OTHER): Payer: Self-pay | Admitting: Radiology

## 2023-10-17 ENCOUNTER — Ambulatory Visit (HOSPITAL_BASED_OUTPATIENT_CLINIC_OR_DEPARTMENT_OTHER)
Admission: RE | Admit: 2023-10-17 | Discharge: 2023-10-17 | Disposition: A | Discharge status: Home / Self Care | Source: Ambulatory Visit | Attending: Internal Medicine | Admitting: Internal Medicine

## 2023-10-17 DIAGNOSIS — Z1231 Encounter for screening mammogram for malignant neoplasm of breast: Secondary | ICD-10-CM | POA: Insufficient documentation

## 2023-10-20 ENCOUNTER — Ambulatory Visit: Payer: Self-pay | Admitting: Internal Medicine

## 2023-10-20 DIAGNOSIS — E559 Vitamin D deficiency, unspecified: Secondary | ICD-10-CM

## 2023-10-20 MED ORDER — VITAMIN D (ERGOCALCIFEROL) 1.25 MG (50000 UNIT) PO CAPS: 50000.0000 [IU] | ORAL_CAPSULE | ORAL | 0 refills | Status: AC

## 2023-12-05 DIAGNOSIS — F331 Major depressive disorder, recurrent, moderate: Secondary | ICD-10-CM | POA: Diagnosis not present

## 2023-12-05 DIAGNOSIS — F410 Panic disorder [episodic paroxysmal anxiety] without agoraphobia: Secondary | ICD-10-CM | POA: Diagnosis not present

## 2023-12-05 DIAGNOSIS — F411 Generalized anxiety disorder: Secondary | ICD-10-CM | POA: Diagnosis not present

## 2024-01-11 ENCOUNTER — Other Ambulatory Visit: Payer: Self-pay | Admitting: Internal Medicine

## 2024-01-11 DIAGNOSIS — E559 Vitamin D deficiency, unspecified: Secondary | ICD-10-CM

## 2024-01-12 ENCOUNTER — Other Ambulatory Visit: Payer: Self-pay | Admitting: Internal Medicine

## 2024-01-12 DIAGNOSIS — E785 Hyperlipidemia, unspecified: Secondary | ICD-10-CM

## 2024-01-12 DIAGNOSIS — E039 Hypothyroidism, unspecified: Secondary | ICD-10-CM

## 2024-01-14 ENCOUNTER — Other Ambulatory Visit (INDEPENDENT_AMBULATORY_CARE_PROVIDER_SITE_OTHER)

## 2024-01-14 DIAGNOSIS — E559 Vitamin D deficiency, unspecified: Secondary | ICD-10-CM

## 2024-01-14 LAB — VITAMIN D 25 HYDROXY (VIT D DEFICIENCY, FRACTURES): VITD: 70.57 ng/mL (ref 30.00–100.00)

## 2024-01-15 ENCOUNTER — Other Ambulatory Visit: Payer: Self-pay | Admitting: Internal Medicine

## 2024-01-15 DIAGNOSIS — E039 Hypothyroidism, unspecified: Secondary | ICD-10-CM

## 2024-01-15 DIAGNOSIS — E785 Hyperlipidemia, unspecified: Secondary | ICD-10-CM

## 2024-01-19 ENCOUNTER — Ambulatory Visit: Payer: Self-pay | Admitting: Internal Medicine
